# Patient Record
Sex: Female | Born: 1965 | Race: White | Hispanic: No | Smoking: Never smoker
Health system: Southern US, Community
[De-identification: ages and names within clinical notes are randomized; demographics above are authoritative.]

## PROBLEM LIST (undated history)

## (undated) DIAGNOSIS — E119 Type 2 diabetes mellitus without complications: Secondary | ICD-10-CM

## (undated) DIAGNOSIS — K219 Gastro-esophageal reflux disease without esophagitis: Secondary | ICD-10-CM

## (undated) DIAGNOSIS — Z8041 Family history of malignant neoplasm of ovary: Secondary | ICD-10-CM

## (undated) DIAGNOSIS — I10 Essential (primary) hypertension: Secondary | ICD-10-CM

## (undated) DIAGNOSIS — E611 Iron deficiency: Secondary | ICD-10-CM

## (undated) DIAGNOSIS — F32A Depression, unspecified: Secondary | ICD-10-CM

## (undated) DIAGNOSIS — F329 Major depressive disorder, single episode, unspecified: Secondary | ICD-10-CM

## (undated) HISTORY — DX: Gastro-esophageal reflux disease without esophagitis: K21.9

## (undated) HISTORY — DX: Type 2 diabetes mellitus without complications: E11.9

## (undated) HISTORY — PX: LASIK: SHX215

## (undated) HISTORY — PX: BREAST SURGERY: SHX581

## (undated) HISTORY — PX: COLONOSCOPY: SHX174

## (undated) HISTORY — PX: BREAST BIOPSY: SHX20

## (undated) HISTORY — DX: Family history of malignant neoplasm of ovary: Z80.41

## (undated) HISTORY — DX: Depression, unspecified: F32.A

## (undated) HISTORY — DX: Major depressive disorder, single episode, unspecified: F32.9

## (undated) HISTORY — DX: Iron deficiency: E61.1

## (undated) HISTORY — DX: Essential (primary) hypertension: I10

---

## 2005-01-25 ENCOUNTER — Ambulatory Visit: Payer: Self-pay | Admitting: Family Medicine

## 2006-01-10 ENCOUNTER — Ambulatory Visit: Payer: Self-pay

## 2006-02-15 ENCOUNTER — Ambulatory Visit: Payer: Self-pay | Admitting: Gastroenterology

## 2007-01-15 ENCOUNTER — Ambulatory Visit: Payer: Self-pay

## 2008-04-13 ENCOUNTER — Ambulatory Visit: Payer: Self-pay

## 2008-10-27 ENCOUNTER — Ambulatory Visit: Payer: Self-pay

## 2009-09-05 ENCOUNTER — Ambulatory Visit: Payer: Self-pay

## 2009-09-07 ENCOUNTER — Ambulatory Visit: Payer: Self-pay

## 2009-10-29 DIAGNOSIS — M542 Cervicalgia: Secondary | ICD-10-CM | POA: Insufficient documentation

## 2010-03-05 ENCOUNTER — Ambulatory Visit: Payer: Self-pay

## 2010-03-15 HISTORY — PX: CERVICAL DISCECTOMY: SHX98

## 2010-03-29 ENCOUNTER — Ambulatory Visit: Payer: Self-pay | Admitting: Neurosurgery

## 2010-04-10 ENCOUNTER — Ambulatory Visit: Payer: Self-pay | Admitting: General Surgery

## 2010-04-13 ENCOUNTER — Encounter: Admission: RE | Admit: 2010-04-13 | Discharge: 2010-04-13 | Payer: Self-pay | Admitting: Neurosurgery

## 2010-05-25 ENCOUNTER — Encounter: Admission: RE | Admit: 2010-05-25 | Discharge: 2010-05-25 | Payer: Self-pay | Admitting: Neurosurgery

## 2010-06-13 ENCOUNTER — Encounter
Admission: RE | Admit: 2010-06-13 | Discharge: 2010-06-13 | Payer: Self-pay | Source: Home / Self Care | Attending: Neurosurgery | Admitting: Neurosurgery

## 2010-07-15 ENCOUNTER — Other Ambulatory Visit: Payer: Self-pay | Admitting: Neurosurgery

## 2010-07-15 DIAGNOSIS — M542 Cervicalgia: Secondary | ICD-10-CM

## 2010-08-01 ENCOUNTER — Other Ambulatory Visit: Payer: Self-pay

## 2010-08-01 ENCOUNTER — Ambulatory Visit
Admission: RE | Admit: 2010-08-01 | Discharge: 2010-08-01 | Disposition: A | Discharge status: Home / Self Care | Payer: BC Managed Care – PPO | Source: Ambulatory Visit | Attending: Neurosurgery | Admitting: Neurosurgery

## 2010-08-01 DIAGNOSIS — M542 Cervicalgia: Secondary | ICD-10-CM

## 2010-10-25 ENCOUNTER — Ambulatory Visit: Payer: Self-pay | Admitting: General Surgery

## 2012-06-25 HISTORY — PX: BREAST BIOPSY: SHX20

## 2013-01-28 ENCOUNTER — Emergency Department: Payer: Self-pay | Admitting: Emergency Medicine

## 2013-05-07 LAB — CBC AND DIFFERENTIAL
HCT: 38 % (ref 36–46)
Hemoglobin: 12 g/dL (ref 12.0–16.0)
Platelets: 202 10*3/uL (ref 150–399)
WBC: 5.6 10^3/mL

## 2013-05-07 LAB — BASIC METABOLIC PANEL
BUN: 12 mg/dL (ref 4–21)
Creatinine: 0.7 mg/dL (ref 0.5–1.1)
Glucose: 136 mg/dL
Potassium: 3.9 mmol/L (ref 3.4–5.3)
Sodium: 141 mmol/L (ref 137–147)

## 2013-05-07 LAB — HEPATIC FUNCTION PANEL
ALT: 20 U/L (ref 7–35)
AST: 24 U/L (ref 13–35)

## 2013-05-15 LAB — TSH: TSH: 1.52 u[IU]/mL (ref <=5.90)

## 2013-06-29 ENCOUNTER — Ambulatory Visit: Payer: Self-pay | Admitting: Gastroenterology

## 2014-06-25 DIAGNOSIS — Z1371 Encounter for nonprocreative screening for genetic disease carrier status: Secondary | ICD-10-CM

## 2014-06-25 HISTORY — DX: Encounter for nonprocreative screening for genetic disease carrier status: Z13.71

## 2014-11-25 ENCOUNTER — Ambulatory Visit: Payer: BC Managed Care – PPO | Admitting: Podiatry

## 2014-11-26 ENCOUNTER — Other Ambulatory Visit: Payer: Self-pay

## 2014-11-26 ENCOUNTER — Other Ambulatory Visit: Payer: Self-pay | Admitting: Gastroenterology

## 2014-11-26 MED ORDER — AMLODIPINE BESYLATE 2.5 MG PO TABS: 2.5000 mg | ORAL_TABLET | Freq: Every day | ORAL | Status: DC

## 2015-01-05 ENCOUNTER — Other Ambulatory Visit: Payer: Self-pay

## 2015-01-05 DIAGNOSIS — D509 Iron deficiency anemia, unspecified: Secondary | ICD-10-CM

## 2015-01-05 MED ORDER — FERROUS SULFATE 325 (65 FE) MG PO TABS: 325.0000 mg | ORAL_TABLET | Freq: Every day | ORAL | Status: DC

## 2015-04-04 ENCOUNTER — Other Ambulatory Visit: Payer: Self-pay

## 2015-04-04 DIAGNOSIS — I10 Essential (primary) hypertension: Secondary | ICD-10-CM

## 2015-04-04 MED ORDER — HYDROCHLOROTHIAZIDE 12.5 MG PO CAPS: 12.5000 mg | ORAL_CAPSULE | Freq: Every day | ORAL | Status: DC

## 2015-04-04 NOTE — Telephone Encounter (Signed)
Last OV 05/2014  Thanks,   -Laura  

## 2015-07-18 DIAGNOSIS — J309 Allergic rhinitis, unspecified: Secondary | ICD-10-CM | POA: Insufficient documentation

## 2015-07-18 DIAGNOSIS — K219 Gastro-esophageal reflux disease without esophagitis: Secondary | ICD-10-CM | POA: Insufficient documentation

## 2015-07-18 DIAGNOSIS — F4320 Adjustment disorder, unspecified: Secondary | ICD-10-CM | POA: Insufficient documentation

## 2015-07-18 DIAGNOSIS — G2581 Restless legs syndrome: Secondary | ICD-10-CM | POA: Insufficient documentation

## 2015-07-28 ENCOUNTER — Encounter: Payer: Self-pay | Admitting: Family Medicine

## 2015-07-28 ENCOUNTER — Ambulatory Visit (INDEPENDENT_AMBULATORY_CARE_PROVIDER_SITE_OTHER): Payer: BC Managed Care – PPO | Admitting: Family Medicine

## 2015-07-28 VITALS — BP 142/110 | HR 84 | Temp 98.5°F | Resp 16 | Wt 174.4 lb

## 2015-07-28 DIAGNOSIS — I10 Essential (primary) hypertension: Secondary | ICD-10-CM

## 2015-07-28 DIAGNOSIS — R634 Abnormal weight loss: Secondary | ICD-10-CM

## 2015-07-28 NOTE — Progress Notes (Signed)
Subjective:     Patient ID: Misty Graham, female   DOB: 03-27-1966, 50 y.o.   MRN: [id number]  Hypertension This is a recurrent problem. The current episode started more than 1 year ago. The problem is unchanged. The problem is uncontrolled. Associated symptoms include blurred vision (needs her glasses more frequently. ), headaches (has headache today. ) and malaise/fatigue. Pertinent negatives include no anxiety, chest pain, neck pain, orthopnea, palpitations, peripheral edema, PND, shortness of breath or sweats. Risk factors for coronary artery disease include family history. Past treatments include diuretics and calcium channel blockers. The current treatment provides no improvement. There are no compliance problems.  There is no history of angina, kidney disease, CAD/MI, CVA, heart failure, left ventricular hypertrophy, PVD, renovascular disease, retinopathy or a thyroid problem. There is no history of chronic renal disease, coarctation of the aorta, hyperaldosteronism, hypercortisolism, hyperparathyroidism, a hypertension causing med, pheochromocytoma or sleep apnea.  Depression        This is a recurrent problem.  The current episode started more than 1 year ago.   The onset quality is gradual.   The problem occurs intermittently.The problem is unchanged (more as a mood stabilizer.  ).  Associated symptoms include fatigue and headaches (has headache today. ).  Associated symptoms include no decreased concentration, no helplessness, no hopelessness, does not have insomnia, not irritable, no restlessness, no decreased interest, no appetite change, no body aches, no myalgias, no indigestion, not sad and no suicidal ideas.     The symptoms are aggravated by work stress and family issues (Nothing out of the ordinary. ).  Past treatments include SSRIs - Selective serotonin reuptake inhibitors.  Compliance with treatment is good.  Previous treatment provided no relief relief.   Pertinent negatives include no  thyroid problem and no anxiety. Weight Loss Pt has lost 20 pounds since September, which is unexplained. Not under any stress. Not sure why she is loosing weight. Not decreasing her food intake. Not dieting. No diet pills.  Appetite has increased. Wants to eat all the time.  Was surprised that she had lost weight.  No night sweats, no fevers, no bloody stools, no change in bowel habits. Had normal colonoscopy several years ago. No family history that she is concerned about.     Review of Systems  Constitutional: Positive for malaise/fatigue and fatigue. Negative for appetite change.  Eyes: Positive for blurred vision (needs her glasses more frequently. ).  Respiratory: Negative for shortness of breath.   Cardiovascular: Negative for chest pain, palpitations, orthopnea and PND.  Musculoskeletal: Negative for myalgias and neck pain.  Neurological: Positive for headaches (has headache today. ).  Psychiatric/Behavioral: Positive for depression. Negative for suicidal ideas and decreased concentration. The patient does not have insomnia.    Filed Vitals:   07/28/15 1613  BP: 142/110  Pulse: 84  Temp: 98.5 F (36.9 C)  Resp: 16       Depression screen PHQ 2/9 07/28/2015  Decreased Interest 1  Down, Depressed, Hopeless 0  PHQ - 2 Score 1   Patient Active Problem List   Diagnosis Date Noted  . 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 Allergic rhinitis 07/18/2015  . Acid reflux 07/18/2015  . Restless leg 07/18/2015  . BP (high blood pressure) 04/04/2015  . Iron deficiency anemia 01/05/2015  . Cervical pain 10/29/2009   Past Medical History  Diagnosis Date  . Hypertension   . Depression   . Iron deficiency    Current Outpatient Prescriptions on File Prior to Visit  Medication Sig  . amLODipine (NORVASC) 2.5 MG tablet Take 1 tablet (2.5 mg total) by mouth daily.  . cetirizine (ZYRTEC ALLERGY) 10 MG tablet Take by mouth.  . DEXILANT 60 MG capsule TAKE 1 CAPSULE BY MOUTH DAILY  .  escitalopram (LEXAPRO) 20 MG tablet Take by mouth.  . ferrous sulfate 325 (65 FE) MG tablet Take 1 tablet (325 mg total) by mouth daily with breakfast.  . hydrochlorothiazide (MICROZIDE) 12.5 MG capsule Take 1 capsule (12.5 mg total) by mouth daily.   No current facility-administered medications on file prior to visit.   No Known Allergies Past Surgical History  Procedure Laterality Date  . Cervical discectomy  03/15/2010  . Vaginal birth after cesarean section  1993 and 1997  . Cesarean section  1991  . Breast surgery Right     biopsy; Dr. Evette Cristal  . Lasik     Social History   Social History  . Marital Status: Married    Spouse Name: N/A  . Number of Children: N/A  . Years of Education: N/A   Occupational History  . Not on file.   Social History Main Topics  . Smoking status: Never Smoker   . Smokeless tobacco: Never Used  . Alcohol Use: No  . Drug Use: No  . Sexual Activity: Not on file   Other Topics Concern  . Not on file   Social History Narrative   Family History  Problem Relation Age of Onset  . Lung cancer Mother   . COPD Father   . Hypertension Father   . Allergies Father   . Coronary artery disease Father   . Healthy Sister   . Heart attack Paternal Grandmother   . Heart attack Paternal Grandfather      Objective:   Physical Exam  Constitutional: She is oriented to person, place, and time. She appears well-developed and well-nourished. She is not irritable.  HENT:  Head: Normocephalic and atraumatic.  Right Ear: External ear normal.  Left Ear: External ear normal.  Mouth/Throat: Oropharynx is clear and moist.  Eyes: Conjunctivae are normal. Pupils are equal, round, and reactive to light.  Neck: Normal range of motion. Neck supple.  Cardiovascular: Normal rate and regular rhythm.   Pulmonary/Chest: Effort normal and breath sounds normal.  Abdominal: Soft. Bowel sounds are normal.  Neurological: She is alert and oriented to person, place, and  time.  Psychiatric: She has a normal mood and affect. Her behavior is normal. Thought content normal.   BP 142/110 mmHg  Pulse 84  Temp(Src) 98.5 F (36.9 C) (Oral)  Resp 16  Wt 174 lb 6.4 oz (79.107 kg)  LMP  (Within Months)     Assessment:     Will check labs and consider scan if needed.     Plan:     1. Abnormal weight loss Will check labs and further plan pending these results.   - CBC with Differential/Platelet - TSH - Comprehensive metabolic panel  2. Essential hypertension Elevated.  Will check labs and adjust medication as needed.   - TSH - Hemoglobin A1c  Lorie Phenix, MD

## 2015-07-29 ENCOUNTER — Telehealth: Payer: Self-pay

## 2015-07-29 ENCOUNTER — Ambulatory Visit (INDEPENDENT_AMBULATORY_CARE_PROVIDER_SITE_OTHER): Payer: BC Managed Care – PPO | Admitting: Family Medicine

## 2015-07-29 ENCOUNTER — Encounter: Payer: Self-pay | Admitting: Family Medicine

## 2015-07-29 VITALS — BP 144/92 | HR 84 | Temp 98.7°F | Resp 16 | Wt 172.0 lb

## 2015-07-29 DIAGNOSIS — I10 Essential (primary) hypertension: Secondary | ICD-10-CM | POA: Diagnosis not present

## 2015-07-29 DIAGNOSIS — E119 Type 2 diabetes mellitus without complications: Secondary | ICD-10-CM

## 2015-07-29 LAB — COMPREHENSIVE METABOLIC PANEL
ALT: 98 IU/L — ABNORMAL HIGH (ref 0–32)
AST: 52 IU/L — ABNORMAL HIGH (ref 0–40)
Albumin/Globulin Ratio: 2.1 (ref 1.1–2.5)
Albumin: 4.8 g/dL (ref 3.5–5.5)
Alkaline Phosphatase: 129 IU/L — ABNORMAL HIGH (ref 39–117)
BUN/Creatinine Ratio: 15 (ref 9–23)
BUN: 11 mg/dL (ref 6–24)
Bilirubin Total: 0.5 mg/dL (ref 0.0–1.2)
CO2: 27 mmol/L (ref 18–29)
Calcium: 9.9 mg/dL (ref 8.7–10.2)
Chloride: 93 mmol/L — ABNORMAL LOW (ref 96–106)
Creatinine, Ser: 0.74 mg/dL (ref 0.57–1.00)
GFR calc Af Amer: 109 mL/min/{1.73_m2} (ref >59)
GFR calc non Af Amer: 95 mL/min/{1.73_m2} (ref >59)
Globulin, Total: 2.3 g/dL (ref 1.5–4.5)
Glucose: 337 mg/dL — ABNORMAL HIGH (ref 65–99)
Potassium: 4 mmol/L (ref 3.5–5.2)
Sodium: 138 mmol/L (ref 134–144)
Total Protein: 7.1 g/dL (ref 6.0–8.5)

## 2015-07-29 LAB — CBC WITH DIFFERENTIAL/PLATELET
Basophils Absolute: 0 10*3/uL (ref 0.0–0.2)
Basos: 1 %
EOS (ABSOLUTE): 0.2 10*3/uL (ref 0.0–0.4)
Eos: 2 %
Hematocrit: 49.2 % — ABNORMAL HIGH (ref 34.0–46.6)
Hemoglobin: 16.9 g/dL — ABNORMAL HIGH (ref 11.1–15.9)
Immature Grans (Abs): 0 10*3/uL (ref 0.0–0.1)
Immature Granulocytes: 0 %
Lymphocytes Absolute: 3.4 10*3/uL — ABNORMAL HIGH (ref 0.7–3.1)
Lymphs: 49 %
MCH: 32.2 pg (ref 26.6–33.0)
MCHC: 34.3 g/dL (ref 31.5–35.7)
MCV: 94 fL (ref 79–97)
Monocytes Absolute: 0.4 10*3/uL (ref 0.1–0.9)
Monocytes: 6 %
Neutrophils Absolute: 2.9 10*3/uL (ref 1.4–7.0)
Neutrophils: 42 %
Platelets: 188 10*3/uL (ref 150–379)
RBC: 5.25 x10E6/uL (ref 3.77–5.28)
RDW: 13.3 % (ref 12.3–15.4)
WBC: 7 10*3/uL (ref 3.4–10.8)

## 2015-07-29 LAB — HEMOGLOBIN A1C
Est. average glucose Bld gHb Est-mCnc: 278 mg/dL
Hgb A1c MFr Bld: 11.3 % — ABNORMAL HIGH (ref 4.8–5.6)

## 2015-07-29 LAB — TSH: TSH: 2.73 u[IU]/mL (ref 0.450–4.500)

## 2015-07-29 MED ORDER — METFORMIN HCL 500 MG PO TABS: 500.0000 mg | ORAL_TABLET | Freq: Two times a day (BID) | ORAL | Status: DC

## 2015-07-29 MED ORDER — ONETOUCH DELICA LANCETS FINE MISC: Status: AC

## 2015-07-29 MED ORDER — LISINOPRIL-HYDROCHLOROTHIAZIDE 10-12.5 MG PO TABS: 1.0000 | ORAL_TABLET | Freq: Every day | ORAL | Status: DC

## 2015-07-29 MED ORDER — GLIPIZIDE ER 5 MG PO TB24: 5.0000 mg | ORAL_TABLET | Freq: Every day | ORAL | Status: DC

## 2015-07-29 MED ORDER — GLUCOSE BLOOD VI STRP: ORAL_STRIP | Status: DC

## 2015-07-29 NOTE — Telephone Encounter (Signed)
Pt advised; apt made for 3:30 this afternoon.   Thanks,   -Vernona Rieger

## 2015-07-29 NOTE — Progress Notes (Signed)
Patient ID: [name], female   DOB: [birth year], 50 y.o.   MRN: [id number]         Patient: SEMYA Graham Female    DOB: 1965/09/28   49 y.o.   MRN: [id number] Visit Date: 07/29/2015  Today's Provider: Lorie Phenix, MD   Chief Complaint  Patient presents with  . Diabetes   Subjective:    Diabetes She presents for her initial diabetic visit. She has type 2 diabetes mellitus. Hypoglycemia symptoms include headaches. Pertinent negatives for hypoglycemia include no dizziness, seizures or tremors. Associated symptoms include blurred vision, fatigue, polydipsia, polyphagia, polyuria and visual change. Pertinent negatives for diabetes include no foot ulcerations and no weakness. Risk factors for coronary artery disease include family history and hypertension. When asked about current treatments, none were reported. An ACE inhibitor/angiotensin II receptor blocker is not being taken. She does not see a podiatrist.Eye exam is current.       No Known Allergies Previous Medications   AMLODIPINE (NORVASC) 2.5 MG TABLET    Take 1 tablet (2.5 mg total) by mouth daily.   CETIRIZINE (ZYRTEC ALLERGY) 10 MG TABLET    Take by mouth.   DEXILANT 60 MG CAPSULE    TAKE 1 CAPSULE BY MOUTH DAILY   ESCITALOPRAM (LEXAPRO) 20 MG TABLET    Take by mouth.   FERROUS SULFATE 325 (65 FE) MG TABLET    Take 1 tablet (325 mg total) by mouth daily with breakfast.   HYDROCHLOROTHIAZIDE (MICROZIDE) 12.5 MG CAPSULE    Take 1 capsule (12.5 mg total) by mouth daily.    Review of Systems  Constitutional: Positive for appetite change, fatigue and unexpected weight change. Negative for fever, chills, diaphoresis and activity change.  Eyes: Positive for blurred vision.  Respiratory: Negative.   Cardiovascular: Negative.   Gastrointestinal: Negative.   Endocrine: Positive for heat intolerance, polydipsia, polyphagia and polyuria. Negative for cold intolerance.  Musculoskeletal: Negative.   Neurological: Positive for  light-headedness, numbness (Numbness in her face) and headaches. Negative for dizziness, tremors, seizures, syncope and weakness.    Social History  Substance Use Topics  . Smoking status: Never Smoker   . Smokeless tobacco: Never Used  . Alcohol Use: No   Objective:   BP 144/92 mmHg  Pulse 84  Temp(Src) 98.7 F (37.1 C) (Oral)  Resp 16  Wt 172 lb (78.019 kg)  LMP  (Within Months)  Physical Exam  Constitutional: She is oriented to person, place, and time. She appears well-developed and well-nourished.  Neurological: She is alert and oriented to person, place, and time.  Psychiatric: She has a normal mood and affect. Her behavior is normal. Judgment and thought content normal.      Assessment & Plan:      1. Type 2 diabetes mellitus without complication, without long-term current use of insulin (HCC) New problem. HgA1c is over 11.  Taught how to use monitor today and discussed diagnosis and treatment. Will start medication as noted. Refer to lifestyle center and recheck OV in 4 weeks.  Patient instructed to call back if condition worsens or does not improve.    - glucose blood (ONETOUCH VERIO) test strip; To check blood sugar once a day. DX E11.9  Dispense: 100 each; Refill: 5 - ONETOUCH DELICA LANCETS FINE MISC; To check blood sugar once a day DX:  E11.9  Dispense: 100 each; Refill: 5 - metFORMIN (GLUCOPHAGE) 500 MG tablet; Take 1 tablet (500 mg total) by mouth 2 (two) times daily with a  meal.  Dispense: 60 tablet; Refill: 5 - glipiZIDE (GLIPIZIDE XL) 5 MG 24 hr tablet; Take 1 tablet (5 mg total) by mouth daily with breakfast.  Dispense: 30 tablet; Refill: 5 - Amb Referral to Nutrition and Diabetic E  2. Essential hypertension Not at goal. Will add Lisinopril in light of diabetes and recheck ov and labs in 4 weeks.   - lisinopril-hydrochlorothiazide (PRINZIDE,ZESTORETIC) 10-12.5 MG tablet; Take 1 tablet by mouth daily.  Dispense: 30 tablet; Refill: 5  Patient was seen and  examined by Leo Grosser, MD, and note scribed by Kavin Leech, CMA.  I have reviewed the document for accuracy and completeness and I agree with above. - Leo Grosser, MD   Lorie Phenix, MD  [facility]

## 2015-07-29 NOTE — Telephone Encounter (Signed)
-----   Message from Lorie Phenix, MD sent at 07/29/2015 12:05 PM EST ----- Needs OV secondary to high blood sugar. Does have diabetes that she was concerned about.   Thanks.

## 2015-08-04 ENCOUNTER — Encounter: Payer: Self-pay | Admitting: Family Medicine

## 2015-08-08 ENCOUNTER — Other Ambulatory Visit: Payer: Self-pay

## 2015-08-08 ENCOUNTER — Encounter: Payer: Self-pay | Admitting: Dietician

## 2015-08-08 ENCOUNTER — Encounter: Payer: BC Managed Care – PPO | Attending: Family Medicine | Admitting: Dietician

## 2015-08-08 VITALS — BP 146/83 | Ht 66.0 in | Wt 173.6 lb

## 2015-08-08 DIAGNOSIS — E119 Type 2 diabetes mellitus without complications: Secondary | ICD-10-CM

## 2015-08-08 DIAGNOSIS — D509 Iron deficiency anemia, unspecified: Secondary | ICD-10-CM

## 2015-08-08 MED ORDER — FERROUS SULFATE 325 (65 FE) MG PO TABS: 325.0000 mg | ORAL_TABLET | Freq: Every day | ORAL | Status: DC

## 2015-08-08 NOTE — Progress Notes (Signed)
Diabetes Self-Management Education  Visit Type: First/Initial  Appt. Start Time: 1500 Appt. End Time: 1600  08/08/2015  [name], identified by name and date of birth, is a 50 y.o. female with a diagnosis of Diabetes: Type 2.   ASSESSMENT  Blood pressure 146/83, height  (1.676 m), weight 173 lb 9.6 oz (78.744 kg). Body mass index is 28.03 kg/(m^2).  Lacks knowledge of diabetes care  and diet Does not eat a bedtime snack Does no regular exercise Had recent low BG (weak, shaky,sweaty) which resolved when treated Recent A1C 11.3%       Diabetes Self-Management Education - 08/08/15 1845    Visit Information   Visit Type First/Initial   Initial Visit   Diabetes Type Type 2   Health Coping   How would you rate your overall health? Fair   Psychosocial Assessment   Patient Belief/Attitude about Diabetes Motivated to manage diabetes   Self-care barriers None   Other persons present Patient   Patient Concerns Glycemic Control;Weight Control;Medication  prevent complications   Special Needs None   Preferred Learning Style Auditory;Visual;Hands on   Learning Readiness Ready   What is the last grade level you completed in school? 16   Complications   How often do you check your blood sugar? --  2-3x/day   Fasting Blood glucose range (mg/dL) 40-981;191-478;295-621   Postprandial Blood glucose range (mg/dL) 308-657;846-962;>952   Have you had a dilated eye exam in the past 12 months? Yes  03-2015   Have you had a dental exam in the past 12 months? No  2 yr ago-appt 08-10-15   Dietary Intake   Breakfast --  eats breakfast at 6:45am   Snack (morning) --  eats snack at 9:30am=snack foods   Lunch --  eats lunch at 12:30p=eats fried foods 4-5x/wk, eats sweets/desserts 2-3x/wk   Snack (afternoon) --  eats snack at 3p=snack foods   Dinner --  eats supper at [employer] (evening) --  none   Beverage(s) --  drinks  diet sodas 6-7/day   Exercise   Exercise Type  ADL's   Patient Education   Previous Diabetes Education No   Disease state  --  discussed type 2 diabetes and treatment options   Nutrition management  Role of diet in the treatment of diabetes and the relationship between the three main macronutrients and blood glucose level;Food label reading, portion sizes and measuring food.;Carbohydrate counting   Physical activity and exercise  Role of exercise on diabetes management, blood pressure control and cardiac health.   Medications Reviewed patients medication for diabetes, action, purpose, timing of dose and side effects.   Monitoring Purpose and frequency of SMBG.;Taught/discussed recording of test results and interpretation of SMBG.;Identified appropriate SMBG and/or A1C goals.;Yearly dilated eye exam   Acute complications Taught treatment of hypoglycemia - the 15 rule.   Chronic complications Relationship between chronic complications and blood glucose control;Dental care;Retinopathy and reason for yearly dilated eye exams;Identified and discussed with patient  current chronic complications   Psychosocial adjustment Role of stress on diabetes   Personal strategies to promote health Helped patient develop diabetes management plan for (enter comment);Lifestyle issues that need to be addressed for better diabetes care      Individualized Plan for Diabetes Self-Management Training:   Learning Objective:  Patient will have a greater understanding of diabetes self-management. Patient education plan is to attend individual and/or group sessions per assessed needs and concerns.   Plan:   Patient Instructions  Check blood sugars 2 x day before breakfast and 2 hrs after supper every day Exercise:  Ride exercise bike 15-20 min 3-4x/wk.  Avoid sugar sweetened drinks (soda, tea, coffee, sports drinks, juices) Limit intake of sweets/desserts and fried foods Make healthy food choices Eat 3 meals day,   3  snacks a day Space meals 4-6 hours  apart Bring blood sugar records to the next appointment/class Get a Midwife fast acting glucose-candy or glucose tablets at all times  Return for appointment/classes on:  08-15-15   Expected Outcomes:    positive Education material provided: [employer] Guidelines, Low BG handout  If problems or questions, patient to contact team via:  [phone]  Future DSME appointment:  08-15-15

## 2015-08-08 NOTE — Patient Instructions (Signed)
  Check blood sugars 2 x day before breakfast and 2 hrs after supper every day Exercise:  Ride exercise bike 15-20 min 3-4x/wk.  Avoid sugar sweetened drinks (soda, tea, coffee, sports drinks, juices) Limit intake of sweets/desserts and fried foods Make healthy food choices Eat 3 meals day,   3  snacks a day Space meals 4-6 hours apart Bring blood sugar records to the next appointment/class Get a Midwife fast acting glucose-candy or glucose tablets at all times  Return for appointment/classes on:  08-15-15

## 2015-08-12 ENCOUNTER — Ambulatory Visit (INDEPENDENT_AMBULATORY_CARE_PROVIDER_SITE_OTHER): Payer: BC Managed Care – PPO | Admitting: Family Medicine

## 2015-08-12 ENCOUNTER — Encounter: Payer: Self-pay | Admitting: Family Medicine

## 2015-08-12 VITALS — BP 126/78 | Temp 98.3°F | Wt 174.0 lb

## 2015-08-12 DIAGNOSIS — E119 Type 2 diabetes mellitus without complications: Secondary | ICD-10-CM

## 2015-08-12 DIAGNOSIS — I10 Essential (primary) hypertension: Secondary | ICD-10-CM | POA: Diagnosis not present

## 2015-08-12 MED ORDER — SITAGLIPTIN PHOSPHATE 100 MG PO TABS: 100.0000 mg | ORAL_TABLET | Freq: Every day | ORAL | Status: DC

## 2015-08-12 MED ORDER — METFORMIN HCL 500 MG PO TABS: 1000.0000 mg | ORAL_TABLET | Freq: Two times a day (BID) | ORAL | Status: DC

## 2015-08-12 MED ORDER — METFORMIN HCL 1000 MG PO TABS: 1000.0000 mg | ORAL_TABLET | Freq: Two times a day (BID) | ORAL | Status: DC

## 2015-08-12 NOTE — Progress Notes (Signed)
Patient ID: Misty Graham, female   DOB: May 27, 1966, 50 y.o.   MRN: [id number]       [name] Female    DOB: Oct 18, 1965   49 y.o.   MRN: [id number] Visit Date: 08/12/2015  Today's Provider: Lorie Phenix, MD   Chief Complaint  Patient presents with  . Diabetes    2 week F/U.   Marland Kitchen Hypertension   Subjective:    HPI  Diabetes Mellitus Type II, Follow-up:   Lab Results  Component Value Date   HGBA1C 11.3* 07/28/2015    Last seen for diabetes 2 weeks ago.  Management since then includes starting Metformin  BID and Glipizide . She reports good compliance with treatment. She is not having side effects.  Home blood sugar records: trend: fluctuating a bit  Episodes of hypoglycemia? no Weight trend: stable Prior visit with dietician: yes - 2 weeks ago.  Current diet: well balanced  Pertinent Labs:    Component Value Date/Time   CREATININE 0.74 07/28/2015 1654   CREATININE 0.7 05/07/2013    Wt Readings from Last 3 Encounters:  08/12/15 174 lb (78.926 kg)  08/08/15 173 lb 9.6 oz (78.744 kg)  07/29/15 172 lb (78.019 kg)     Hypertension, follow-up:  BP Readings from Last 3 Encounters:  08/12/15 126/78  08/08/15 146/83  07/29/15 144/92    She was last seen for hypertension 2 weeks ago.  BP at that visit was 146/83. Management since that visit includes discontinuing HCTZ 12.5 and starting Lisinopril/ HCTZ 10/12.5mg  daily. She reports good compliance with treatment. She is not having side effects.  Patient denies chest pain, chest pressure/discomfort, fatigue, irregular heart beat, palpitations and tachypnea.    Weight trend: stable Wt Readings from Last 3 Encounters:  08/12/15 174 lb (78.926 kg)  08/08/15 173 lb 9.6 oz (78.744 kg)  07/29/15 172 lb (78.019 kg)    Current diet: well balanced          No Known Allergies Previous Medications   CETIRIZINE (ZYRTEC ALLERGY) 10 MG TABLET    Take by mouth daily.    DEXILANT 60 MG  CAPSULE    TAKE 1 CAPSULE BY MOUTH DAILY   ESCITALOPRAM (LEXAPRO) 20 MG TABLET    Take by mouth daily.    FERROUS SULFATE 325 (65 FE) MG TABLET    Take 1 tablet (325 mg total) by mouth daily with breakfast.   GLIPIZIDE (GLIPIZIDE XL) 5 MG 24 HR TABLET    Take 1 tablet (5 mg total) by mouth daily with breakfast.   GLUCOSE BLOOD (ONETOUCH VERIO) TEST STRIP    To check blood sugar once a day. DX E11.9   LISINOPRIL-HYDROCHLOROTHIAZIDE (PRINZIDE,ZESTORETIC) 10-12.5 MG TABLET    Take 1 tablet by mouth daily.   ONETOUCH DELICA LANCETS FINE MISC    To check blood sugar once a day DX:  E11.9    Review of Systems  Constitutional: Negative.   Respiratory: Negative.   Cardiovascular: Negative.   Endocrine: Negative.   Musculoskeletal: Negative.   Neurological: Negative.     Social History  Substance Use Topics  . Smoking status: Never Smoker   . Smokeless tobacco: Never Used  . Alcohol Use: No   Objective:   BP 126/78 mmHg  Temp(Src) 98.3 F (36.8 C)  Wt 174 lb (78.926 kg)  LMP  (Within Months)  Physical Exam  Constitutional: She is oriented to person, place, and time. She appears well-developed and well-nourished.  Neurological: She is alert and  oriented to person, place, and time.  Psychiatric: She has a normal mood and affect. Her behavior is normal. Judgment and thought content normal.      Assessment & Plan:     1. Essential hypertension Stable. F/U pending labs. Continue current medication.   - Comprehensive metabolic panel  2. Type 2 diabetes mellitus without complication, without long-term current use of insulin (HCC) Not at goal. Will increase Metformin to  BID and add Januvia  daily. Will F/U in 6 weeks to reassess. Will also update pneumonia vaccine at that time. Patient is also going to check on diet and exercise.   - CBC with Differential/Platelet - Comprehensive metabolic panel - sitaGLIPtin (JANUVIA) 100 MG tablet; Take 1 tablet (100 mg total) by mouth  daily.  Dispense: 30 tablet; Refill: 5 - metFORMIN (GLUCOPHAGE) 500 MG tablet; Take 2 tablets (1,000 mg total) by mouth 2 (two) times daily with a meal.  Dispense: 60 tablet; Refill: 5     Patient was seen and examined by Leo Grosser, MD, and scribed by Anson [place], CMA.  I have reviewed the document for accuracy and completeness and I agree with above. - Leo Grosser, MD  Lorie Phenix, MD  [facility]

## 2015-08-13 LAB — COMPREHENSIVE METABOLIC PANEL
ALT: 99 IU/L — ABNORMAL HIGH (ref 0–32)
AST: 56 IU/L — ABNORMAL HIGH (ref 0–40)
Albumin/Globulin Ratio: 2.3 (ref 1.1–2.5)
Albumin: 4.8 g/dL (ref 3.5–5.5)
Alkaline Phosphatase: 98 IU/L (ref 39–117)
BUN/Creatinine Ratio: 19 (ref 9–23)
BUN: 12 mg/dL (ref 6–24)
Bilirubin Total: 0.5 mg/dL (ref 0.0–1.2)
CO2: 24 mmol/L (ref 18–29)
Calcium: 10 mg/dL (ref 8.7–10.2)
Chloride: 99 mmol/L (ref 96–106)
Creatinine, Ser: 0.64 mg/dL (ref 0.57–1.00)
GFR calc Af Amer: 120 mL/min/{1.73_m2} (ref >59)
GFR calc non Af Amer: 104 mL/min/{1.73_m2} (ref >59)
Globulin, Total: 2.1 g/dL (ref 1.5–4.5)
Glucose: 156 mg/dL — ABNORMAL HIGH (ref 65–99)
Potassium: 4.2 mmol/L (ref 3.5–5.2)
Sodium: 142 mmol/L (ref 134–144)
Total Protein: 6.9 g/dL (ref 6.0–8.5)

## 2015-08-13 LAB — CBC WITH DIFFERENTIAL/PLATELET
Basophils Absolute: 0 10*3/uL (ref 0.0–0.2)
Basos: 1 %
EOS (ABSOLUTE): 0.1 10*3/uL (ref 0.0–0.4)
Eos: 2 %
Hematocrit: 47 % — ABNORMAL HIGH (ref 34.0–46.6)
Hemoglobin: 16.3 g/dL — ABNORMAL HIGH (ref 11.1–15.9)
Immature Grans (Abs): 0 10*3/uL (ref 0.0–0.1)
Immature Granulocytes: 0 %
Lymphocytes Absolute: 2.5 10*3/uL (ref 0.7–3.1)
Lymphs: 51 %
MCH: 32.5 pg (ref 26.6–33.0)
MCHC: 34.7 g/dL (ref 31.5–35.7)
MCV: 94 fL (ref 79–97)
Monocytes Absolute: 0.4 10*3/uL (ref 0.1–0.9)
Monocytes: 8 %
Neutrophils Absolute: 1.8 10*3/uL (ref 1.4–7.0)
Neutrophils: 38 %
Platelets: 181 10*3/uL (ref 150–379)
RBC: 5.02 x10E6/uL (ref 3.77–5.28)
RDW: 12.5 % (ref 12.3–15.4)
WBC: 4.9 10*3/uL (ref 3.4–10.8)

## 2015-08-15 ENCOUNTER — Encounter: Payer: BC Managed Care – PPO | Admitting: Dietician

## 2015-08-15 VITALS — Ht 66.0 in | Wt 172.4 lb

## 2015-08-15 DIAGNOSIS — E119 Type 2 diabetes mellitus without complications: Secondary | ICD-10-CM | POA: Diagnosis not present

## 2015-08-15 NOTE — Progress Notes (Signed)

## 2015-08-16 ENCOUNTER — Other Ambulatory Visit: Payer: Self-pay | Admitting: Obstetrics & Gynecology

## 2015-08-16 ENCOUNTER — Other Ambulatory Visit: Payer: Self-pay | Admitting: *Deleted

## 2015-08-16 ENCOUNTER — Telehealth: Payer: Self-pay

## 2015-08-16 ENCOUNTER — Encounter: Payer: Self-pay | Admitting: Family Medicine

## 2015-08-16 ENCOUNTER — Inpatient Hospital Stay
Admission: RE | Admit: 2015-08-16 | Discharge: 2015-08-16 | Disposition: A | Discharge status: Home / Self Care | Payer: Self-pay | Source: Ambulatory Visit | Attending: *Deleted | Admitting: *Deleted

## 2015-08-16 DIAGNOSIS — Z9289 Personal history of other medical treatment: Secondary | ICD-10-CM

## 2015-08-16 DIAGNOSIS — R928 Other abnormal and inconclusive findings on diagnostic imaging of breast: Secondary | ICD-10-CM

## 2015-08-16 NOTE — Telephone Encounter (Signed)
Advised pt of lab results. Pt verbally acknowledges understanding. Malaysha Arlen Drozdowski, CMA   

## 2015-08-16 NOTE — Telephone Encounter (Signed)
-----  Message from Margarita Rana, MD sent at 08/16/2015  3:13 PM EST ----- Labs improving but not back to normal. Repeat met c and cbc in 4 weeks.  Thanks.

## 2015-08-16 NOTE — Telephone Encounter (Signed)
Pt is returning call.  CB#[phone]/MW

## 2015-08-16 NOTE — Telephone Encounter (Signed)
LMTCB Keinan Brouillet Drozdowski, CMA  

## 2015-08-22 ENCOUNTER — Encounter: Payer: BC Managed Care – PPO | Admitting: Dietician

## 2015-08-22 VITALS — Wt 170.2 lb

## 2015-08-22 DIAGNOSIS — E119 Type 2 diabetes mellitus without complications: Secondary | ICD-10-CM | POA: Diagnosis not present

## 2015-08-22 NOTE — Progress Notes (Signed)

## 2015-08-23 ENCOUNTER — Other Ambulatory Visit: Payer: Self-pay

## 2015-08-23 DIAGNOSIS — F4322 Adjustment disorder with anxiety: Secondary | ICD-10-CM

## 2015-08-23 MED ORDER — ESCITALOPRAM OXALATE 20 MG PO TABS: 20.0000 mg | ORAL_TABLET | Freq: Every day | ORAL | Status: DC

## 2015-08-24 ENCOUNTER — Encounter: Payer: Self-pay | Admitting: Family Medicine

## 2015-08-29 ENCOUNTER — Encounter: Payer: Self-pay | Admitting: Dietician

## 2015-08-29 ENCOUNTER — Other Ambulatory Visit: Payer: Self-pay

## 2015-08-29 ENCOUNTER — Encounter: Payer: BC Managed Care – PPO | Attending: Family Medicine | Admitting: Dietician

## 2015-08-29 VITALS — BP 134/88 | Ht 66.0 in | Wt 172.6 lb

## 2015-08-29 DIAGNOSIS — E119 Type 2 diabetes mellitus without complications: Secondary | ICD-10-CM | POA: Diagnosis present

## 2015-08-29 DIAGNOSIS — K2100 Gastro-esophageal reflux disease with esophagitis, without bleeding: Secondary | ICD-10-CM

## 2015-08-29 MED ORDER — DEXLANSOPRAZOLE 60 MG PO CPDR: 1.0000 | DELAYED_RELEASE_CAPSULE | Freq: Every day | ORAL | Status: DC

## 2015-08-29 NOTE — Progress Notes (Signed)

## 2015-09-01 ENCOUNTER — Ambulatory Visit
Admission: RE | Admit: 2015-09-01 | Discharge: 2015-09-01 | Disposition: A | Discharge status: Home / Self Care | Payer: BC Managed Care – PPO | Source: Ambulatory Visit | Attending: Obstetrics & Gynecology | Admitting: Obstetrics & Gynecology

## 2015-09-01 ENCOUNTER — Other Ambulatory Visit: Payer: Self-pay | Admitting: Obstetrics & Gynecology

## 2015-09-01 DIAGNOSIS — R928 Other abnormal and inconclusive findings on diagnostic imaging of breast: Secondary | ICD-10-CM | POA: Diagnosis present

## 2015-09-02 ENCOUNTER — Other Ambulatory Visit: Payer: Self-pay

## 2015-09-02 DIAGNOSIS — F4322 Adjustment disorder with anxiety: Secondary | ICD-10-CM

## 2015-09-02 MED ORDER — ESCITALOPRAM OXALATE 20 MG PO TABS: 20.0000 mg | ORAL_TABLET | Freq: Every day | ORAL | Status: DC

## 2015-09-05 ENCOUNTER — Encounter: Payer: Self-pay | Admitting: Dietician

## 2015-09-06 ENCOUNTER — Other Ambulatory Visit: Payer: Self-pay | Admitting: Family Medicine

## 2015-09-06 DIAGNOSIS — E119 Type 2 diabetes mellitus without complications: Secondary | ICD-10-CM

## 2015-09-12 ENCOUNTER — Encounter: Payer: Self-pay | Admitting: Family Medicine

## 2015-09-12 DIAGNOSIS — I10 Essential (primary) hypertension: Secondary | ICD-10-CM

## 2015-09-12 NOTE — Telephone Encounter (Signed)
It looks like she is due for Met C and CBC.   Thanks,   -Mickel Baas

## 2015-09-12 NOTE — Telephone Encounter (Signed)
Please print out lab slip and notify patient.  Thanks.

## 2015-09-13 NOTE — Telephone Encounter (Signed)
Lab sheet at the front desk; pt advised.   Thanks,   -Dann Galicia  

## 2015-09-14 ENCOUNTER — Telehealth: Payer: Self-pay

## 2015-09-14 LAB — CBC WITH DIFFERENTIAL/PLATELET
Basophils Absolute: 0.1 10*3/uL (ref 0.0–0.2)
Basos: 1 %
EOS (ABSOLUTE): 0.3 10*3/uL (ref 0.0–0.4)
Eos: 4 %
Hematocrit: 42.7 % (ref 34.0–46.6)
Hemoglobin: 14.6 g/dL (ref 11.1–15.9)
Immature Grans (Abs): 0 10*3/uL (ref 0.0–0.1)
Immature Granulocytes: 0 %
Lymphocytes Absolute: 3.5 10*3/uL — ABNORMAL HIGH (ref 0.7–3.1)
Lymphs: 47 %
MCH: 31.9 pg (ref 26.6–33.0)
MCHC: 34.2 g/dL (ref 31.5–35.7)
MCV: 93 fL (ref 79–97)
Monocytes Absolute: 0.6 10*3/uL (ref 0.1–0.9)
Monocytes: 8 %
Neutrophils Absolute: 3 10*3/uL (ref 1.4–7.0)
Neutrophils: 40 %
Platelets: 196 10*3/uL (ref 150–379)
RBC: 4.58 x10E6/uL (ref 3.77–5.28)
RDW: 13.7 % (ref 12.3–15.4)
WBC: 7.4 10*3/uL (ref 3.4–10.8)

## 2015-09-14 LAB — COMPREHENSIVE METABOLIC PANEL
ALT: 69 IU/L — ABNORMAL HIGH (ref 0–32)
AST: 35 IU/L (ref 0–40)
Albumin/Globulin Ratio: 2.4 — ABNORMAL HIGH (ref 1.2–2.2)
Albumin: 4.8 g/dL (ref 3.5–5.5)
Alkaline Phosphatase: 86 IU/L (ref 39–117)
BUN/Creatinine Ratio: 14 (ref 9–23)
BUN: 10 mg/dL (ref 6–24)
Bilirubin Total: 0.4 mg/dL (ref 0.0–1.2)
CO2: 25 mmol/L (ref 18–29)
Calcium: 9.4 mg/dL (ref 8.7–10.2)
Chloride: 98 mmol/L (ref 96–106)
Creatinine, Ser: 0.7 mg/dL (ref 0.57–1.00)
GFR calc Af Amer: 117 mL/min/{1.73_m2} (ref >59)
GFR calc non Af Amer: 101 mL/min/{1.73_m2} (ref >59)
Globulin, Total: 2 g/dL (ref 1.5–4.5)
Glucose: 136 mg/dL — ABNORMAL HIGH (ref 65–99)
Potassium: 3.9 mmol/L (ref 3.5–5.2)
Sodium: 141 mmol/L (ref 134–144)
Total Protein: 6.8 g/dL (ref 6.0–8.5)

## 2015-09-14 NOTE — Telephone Encounter (Signed)
Pt advised.   Thanks,   -Jeymi Hepp  

## 2015-09-14 NOTE — Telephone Encounter (Signed)
-----   Message from Lorie PhenixNancy Maloney, MD sent at 09/14/2015  6:19 AM EDT ----- Labs looking much better. One liver enzyme slightly off. Recheck in 2 months.  Follow up as scheduled. Thanks.

## 2015-09-16 ENCOUNTER — Ambulatory Visit (INDEPENDENT_AMBULATORY_CARE_PROVIDER_SITE_OTHER): Payer: BC Managed Care – PPO | Admitting: Family Medicine

## 2015-09-16 ENCOUNTER — Encounter: Payer: Self-pay | Admitting: Family Medicine

## 2015-09-16 VITALS — BP 126/84 | HR 84 | Temp 98.8°F | Resp 16 | Wt 174.0 lb

## 2015-09-16 DIAGNOSIS — E119 Type 2 diabetes mellitus without complications: Secondary | ICD-10-CM | POA: Diagnosis not present

## 2015-09-16 DIAGNOSIS — Z23 Encounter for immunization: Secondary | ICD-10-CM | POA: Insufficient documentation

## 2015-09-16 DIAGNOSIS — I10 Essential (primary) hypertension: Secondary | ICD-10-CM

## 2015-09-16 MED ORDER — GLIPIZIDE ER 2.5 MG PO TB24: 2.5000 mg | ORAL_TABLET | Freq: Every day | ORAL | Status: DC

## 2015-09-16 NOTE — Progress Notes (Signed)
[name] Female    DOB: 10-Dec-1965   50 y.o.   MRN: [id number] Visit Date: 09/16/2015  Today's Provider: Lorie PhenixNancy Milta Croson, MD   Chief Complaint  Patient presents with  . Diabetes  . Hypertension   Subjective:    HPI  Diabetes Mellitus Type II, Follow-up:   Lab Results  Component Value Date   HGBA1C 11.3* 07/28/2015   Last seen for diabetes 6 weeks ago.  Management since then includes Started Januvia. She reports excellent compliance with treatment. She is not having side effects.  Current symptoms include none and have been improving. Home blood sugar records: 100's - 120's  Episodes of hypoglycemia? yes - She reports having about 7 lows usually around mid morning.   Most Recent Eye Exam: 03/2015 Weight trend: stable Prior visit with dietician: yes  Current diet: in general, a "healthy" diet   Current exercise: none  ------------------------------------------------------------------------   Hypertension, follow-up:  BP Readings from Last 3 Encounters:  09/16/15 126/84  08/29/15 134/88  08/12/15 126/78    She was last seen for hypertension 6 weeks ago.  Management since that visit includes Added Lisinopril .She reports excellent compliance with treatment. She is not having side effects.  She is not exercising. Outside blood pressures are High 130's / 90's. She is experiencing none.  Patient denies chest pain, fatigue, irregular heart beat, lower extremity edema and palpitations.   Cardiovascular risk factors include diabetes mellitus and hypertension.  ------------------------------------------------------------------------      No Known Allergies Previous Medications   CETIRIZINE (ZYRTEC ALLERGY) 10 MG TABLET    Take by mouth daily.    DEXLANSOPRAZOLE (DEXILANT) 60 MG CAPSULE    Take 1 capsule (60 mg total) by mouth daily. Pt needs a follow up appointment   ESCITALOPRAM (LEXAPRO) 20 MG TABLET    Take 1 tablet (20 mg total) by mouth  daily.   FERROUS SULFATE 325 (65 FE) MG TABLET    Take 1 tablet (325 mg total) by mouth daily with breakfast.   GLIPIZIDE (GLIPIZIDE XL) 5 MG 24 HR TABLET    Take 1 tablet (5 mg total) by mouth daily with breakfast.   LISINOPRIL-HYDROCHLOROTHIAZIDE (PRINZIDE,ZESTORETIC) 10-12.5 MG TABLET    Take 1 tablet by mouth daily.   METFORMIN (GLUCOPHAGE) 1000 MG TABLET    Take 1 tablet (1,000 mg total) by mouth 2 (two) times daily with a meal.   ONETOUCH DELICA LANCETS FINE MISC    To check blood sugar once a day DX:  E11.9   ONETOUCH VERIO TEST STRIP    TEST twice a day   SITAGLIPTIN (JANUVIA) 100 MG TABLET    Take 1 tablet (100 mg total) by mouth daily.    Review of Systems  Constitutional: Negative.   Respiratory: Negative.   Cardiovascular: Negative.   Gastrointestinal: Positive for abdominal pain (Had some lower abdominal pain a week or two ago; but has since resolved. ). Negative for nausea, vomiting, diarrhea, constipation, blood in stool, abdominal distention, anal bleeding and rectal pain.  Neurological: Positive for light-headedness. Negative for numbness.    Social History  Substance Use Topics  . Smoking status: Never Smoker   . Smokeless tobacco: Never Used  . Alcohol Use: No   Objective:   BP 126/84 mmHg  Pulse 84  Temp(Src) 98.8 F (37.1 C) (Oral)  Resp 16  Wt 174 lb (78.926 kg)  LMP  (Within Months)  Physical Exam  Constitutional: She is oriented to  person, place, and time. She appears well-developed and well-nourished.  Cardiovascular: Normal rate and regular rhythm.   Pulmonary/Chest: Effort normal and breath sounds normal.  Neurological: She is alert and oriented to person, place, and time.  Psychiatric: She has a normal mood and affect. Her behavior is normal. Judgment and thought content normal.      Assessment & Plan:     1. Essential hypertension Improved. Tolerating medication without difficulty. Continue current medication and recheck at follow up.    2.  Type 2 diabetes mellitus without complication, without long-term current use of insulin (HCC) Improved. Having some lows. Recommend decrease Glipizide to 2.5 mg and recheck HgbA1c in 6 weeks.  Continue lifestyle changes.   3. Need for 23-polyvalent pneumococcal polysaccharide vaccine Given today.  - Pneumococcal polysaccharide vaccine 23-valent greater than or equal to 2yo subcutaneous/IM    Patient was seen and examined by Leo Grosser, MD, and note scribed by Kavin Leech, CMA.  I have reviewed the document for accuracy and completeness and I agree with above. - Leo Grosser, MD   Lorie Phenix, MD  [facility]

## 2015-09-21 ENCOUNTER — Encounter: Payer: Self-pay | Admitting: Family Medicine

## 2015-09-22 ENCOUNTER — Ambulatory Visit
Admission: RE | Admit: 2015-09-22 | Discharge: 2015-09-22 | Disposition: A | Discharge status: Home / Self Care | Payer: BC Managed Care – PPO | Source: Ambulatory Visit | Attending: Family Medicine | Admitting: Family Medicine

## 2015-09-22 ENCOUNTER — Encounter: Payer: Self-pay | Admitting: Family Medicine

## 2015-09-22 ENCOUNTER — Ambulatory Visit (INDEPENDENT_AMBULATORY_CARE_PROVIDER_SITE_OTHER): Payer: BC Managed Care – PPO | Admitting: Family Medicine

## 2015-09-22 VITALS — BP 106/78 | HR 84 | Temp 98.2°F | Resp 16 | Wt 175.0 lb

## 2015-09-22 DIAGNOSIS — R590 Localized enlarged lymph nodes: Secondary | ICD-10-CM

## 2015-09-22 DIAGNOSIS — M542 Cervicalgia: Secondary | ICD-10-CM | POA: Diagnosis not present

## 2015-09-22 MED ORDER — CYCLOBENZAPRINE HCL 5 MG PO TABS: 5.0000 mg | ORAL_TABLET | Freq: Three times a day (TID) | ORAL | Status: DC | PRN

## 2015-09-22 MED ORDER — PREDNISONE 10 MG PO TABS: ORAL_TABLET | ORAL | Status: AC

## 2015-09-22 NOTE — Progress Notes (Signed)
[name] Female    DOB: 10/13/1965   49 y.o.   MRN: [id number] Visit Date: 09/22/2015  Today's Provider: Mila Merry, MD   Chief Complaint  Patient presents with  . Neck Pain   Subjective:    Neck Pain  Episode onset: 3 days ago woke up with neck pain. The problem occurs constantly. The problem has been unchanged. The pain is present in the right side. The quality of the pain is described as aching and shooting (sharp). Exacerbated by: turning head side to side. Stiffness is present all day. Associated symptoms include headaches and weakness. Pertinent negatives include no chest pain, fever, leg pain, numbness, pain with swallowing, syncope, tingling, trouble swallowing or visual change. She has tried NSAIDs and heat for the symptoms. The treatment provided mild relief.  Pain in neck radiates down her arms. Patient reports she has had neck surgery about 5 years ago.       No Known Allergies Previous Medications   CETIRIZINE (ZYRTEC ALLERGY) 10 MG TABLET    Take by mouth daily.    DEXLANSOPRAZOLE (DEXILANT) 60 MG CAPSULE    Take 1 capsule (60 mg total) by mouth daily. Pt needs a follow up appointment   ESCITALOPRAM (LEXAPRO) 20 MG TABLET    Take 1 tablet (20 mg total) by mouth daily.   FERROUS SULFATE 325 (65 FE) MG TABLET    Take 1 tablet (325 mg total) by mouth daily with breakfast.   GLIPIZIDE (GLUCOTROL XL) 2.5 MG 24 HR TABLET    Take 1 tablet (2.5 mg total) by mouth daily with breakfast.   LISINOPRIL-HYDROCHLOROTHIAZIDE (PRINZIDE,ZESTORETIC) 10-12.5 MG TABLET    Take 1 tablet by mouth daily.   METFORMIN (GLUCOPHAGE) 1000 MG TABLET    Take 1 tablet (1,000 mg total) by mouth 2 (two) times daily with a meal.   ONETOUCH DELICA LANCETS FINE MISC    To check blood sugar once a day DX:  E11.9   ONETOUCH VERIO TEST STRIP    TEST twice a day   SITAGLIPTIN (JANUVIA) 100 MG TABLET    Take 1 tablet (100 mg total) by mouth daily.    Review of Systems  Constitutional:  Negative for fever, chills, appetite change and fatigue.  HENT: Negative for congestion, dental problem, drooling, ear discharge, ear pain, facial swelling, mouth sores, nosebleeds, postnasal drip, rhinorrhea, sinus pressure and trouble swallowing.   Respiratory: Negative for cough, chest tightness and shortness of breath.   Cardiovascular: Negative for chest pain, palpitations and syncope.  Gastrointestinal: Negative for nausea, vomiting and abdominal pain.  Musculoskeletal: Positive for neck pain and neck stiffness.  Neurological: Positive for weakness and headaches. Negative for dizziness, tingling and numbness.    Social History  Substance Use Topics  . Smoking status: Never Smoker   . Smokeless tobacco: Never Used  . Alcohol Use: No   Objective:   BP 106/78 mmHg  Pulse 84  Temp(Src) 98.2 F (36.8 C) (Oral)  Resp 16  Wt 175 lb (79.379 kg)  SpO2 96%  LMP  (Within Months)  Physical Exam  General Appearance:    Alert, cooperative, no distress  HENT:   bilateral TM normal without fluid or infection, throat normal without erythema or exudate and sinuses non-tender. Slightly tender left submandibular lymph nodes.   Eyes:    PERRL, conjunctiva/corneas clear, EOM's intact       Lungs:     Clear to auscultation bilaterally, respirations unlabored  Heart:  Regular rate and rhythm  Neurologic:   Awake, alert, oriented x 3. No apparent focal neurological           defect.   Neck:   Tender right paracervical muscles. Rotation limited to 45 degrees due to pain. Light c-spine tenderness. No gross deformities.        Assessment & Plan:     1. Neck pain  - predniSONE (DELTASONE) 10 MG tablet; 6 tablets for 2 days, then 5 for 2 days, then 4 for 2 days, then 3 for 2 days, then 2 for 2 days, then 1 for 2 days.  Dispense: 42 tablet; Refill: 0 - cyclobenzaprine (FLEXERIL) 5 MG tablet; Take 1-2 tablets (5-10 mg total) by mouth 3 (three) times daily as needed for muscle spasms.  Dispense: 30  tablet; Refill: 1 - DG Cervical Spine Complete; Future  2. LAD (lymphadenopathy), submandibular She states had been sore left side of jaw for 5-6 days. No other signs of infection. Advised to call if this does not resolve within another 7 days.         Mila Merryonald Jhovanny Guinta, MD  [facility]

## 2015-09-23 ENCOUNTER — Ambulatory Visit: Payer: BC Managed Care – PPO | Admitting: Family Medicine

## 2015-10-11 ENCOUNTER — Other Ambulatory Visit: Payer: Self-pay

## 2015-10-11 DIAGNOSIS — K2100 Gastro-esophageal reflux disease with esophagitis, without bleeding: Secondary | ICD-10-CM

## 2015-10-11 MED ORDER — DEXLANSOPRAZOLE 60 MG PO CPDR: 1.0000 | DELAYED_RELEASE_CAPSULE | Freq: Every day | ORAL | Status: DC

## 2015-10-21 ENCOUNTER — Telehealth: Payer: Self-pay | Admitting: Family Medicine

## 2015-10-21 ENCOUNTER — Encounter: Payer: Self-pay | Admitting: Family Medicine

## 2015-10-21 ENCOUNTER — Ambulatory Visit (INDEPENDENT_AMBULATORY_CARE_PROVIDER_SITE_OTHER): Payer: BC Managed Care – PPO | Admitting: Family Medicine

## 2015-10-21 VITALS — BP 120/82 | HR 80 | Temp 97.4°F | Resp 16 | Wt 178.0 lb

## 2015-10-21 DIAGNOSIS — M542 Cervicalgia: Secondary | ICD-10-CM

## 2015-10-21 MED ORDER — MELOXICAM 15 MG PO TABS: 15.0000 mg | ORAL_TABLET | Freq: Every day | ORAL | Status: DC

## 2015-10-21 NOTE — Telephone Encounter (Signed)
[employer] would like a peer to peer to get MRI of cervical spine approved.Phone [phone].Member # [id number]

## 2015-10-21 NOTE — Progress Notes (Signed)
Subjective:    Patient ID: Misty Graham, female    DOB: Dec 02, 1965, 50 y.o.   MRN: [id number]  Neck Pain  This is a recurrent problem. The current episode started in the past 7 days (x 4 days). The problem has been waxing and waning. The pain is present in the right side (with radiation down right arm). The quality of the pain is described as shooting ("stiff"). The pain is at a severity of 5/10 (can get to an 8/10). The pain is moderate. The symptoms are aggravated by position. The pain is worse during the night. Associated symptoms include numbness (right arm). Pertinent negatives include no chest pain, fever, headaches, leg pain, pain with swallowing or weakness. Treatments tried: Prednisone, Flexeril, NSAIDS. The treatment provided mild ("short-term") relief.  Pt has a H/O herniated discs. XRay was performed 09/22/2015. Showed post-surgical fusion.    Review of Systems  Constitutional: Negative for fever.  Cardiovascular: Negative for chest pain.  Musculoskeletal: Positive for neck pain.  Neurological: Positive for numbness (right arm). Negative for weakness and headaches.   BP 120/82 mmHg  Pulse 80  Temp(Src) 97.4 F (36.3 C) (Oral)  Resp 16  Wt 178 lb (80.74 kg)  LMP  (Within Months)   Patient Active Problem List   Diagnosis Date Noted  . Need for 23-polyvalent pneumococcal polysaccharide vaccine 09/16/2015  . Diabetes mellitus (HCC) 09/06/2015  . Abnormal weight loss 07/28/2015  . 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 Allergic rhinitis 07/18/2015  . Acid reflux 07/18/2015  . Restless leg 07/18/2015  . BP (high blood pressure) 04/04/2015  . Iron deficiency anemia 01/05/2015  . Cervical pain 10/29/2009   Past Medical History  Diagnosis Date  . Hypertension   . Depression   . Iron deficiency   . GERD (gastroesophageal reflux disease)    Current Outpatient Prescriptions on File Prior to Visit  Medication Sig  . cetirizine (ZYRTEC ALLERGY) 10 MG tablet Take by mouth  daily.   Marland Kitchen. dexlansoprazole (DEXILANT) 60 MG capsule Take 1 capsule (60 mg total) by mouth daily. PT NEEDS FOLLOW UP APPT. NO MORE REFILLS AFTER THIS RX  . escitalopram (LEXAPRO) 20 MG tablet Take 1 tablet (20 mg total) by mouth daily.  . ferrous sulfate 325 (65 FE) MG tablet Take 1 tablet (325 mg total) by mouth daily with breakfast.  . glipiZIDE (GLUCOTROL XL) 2.5 MG 24 hr tablet Take 1 tablet (2.5 mg total) by mouth daily with breakfast.  . lisinopril-hydrochlorothiazide (PRINZIDE,ZESTORETIC) 10-12.5 MG tablet Take 1 tablet by mouth daily.  . metFORMIN (GLUCOPHAGE) 1000 MG tablet Take 1 tablet (1,000 mg total) by mouth 2 (two) times daily with a meal.  . ONETOUCH DELICA LANCETS FINE MISC To check blood sugar once a day DX:  E11.9  . ONETOUCH VERIO test strip TEST twice a day  . sitaGLIPtin (JANUVIA) 100 MG tablet Take 1 tablet (100 mg total) by mouth daily.  . cyclobenzaprine (FLEXERIL) 5 MG tablet Take 1-2 tablets (5-10 mg total) by mouth 3 (three) times daily as needed for muscle spasms. (Patient not taking: Reported on 10/21/2015)   No current facility-administered medications on file prior to visit.   No Known Allergies Past Surgical History  Procedure Laterality Date  . Cervical discectomy  03/15/2010  . Vaginal birth after cesarean section  1993 and 1997  . Cesarean section  1991  . Breast surgery Right     biopsy; Dr. Evette CristalSankar  . Lasik    . Breast biopsy Right 4-5 yrs  ago    core - neg   Social History   Social History  . Marital Status: Married    Spouse Name: N/A  . Number of Children: N/A  . Years of Education: N/A   Occupational History  . Not on file.   Social History Main Topics  . Smoking status: Never Smoker   . Smokeless tobacco: Never Used  . Alcohol Use: No  . Drug Use: No  . Sexual Activity: Not on file   Other Topics Concern  . Not on file   Social History Narrative   Family History  Problem Relation Age of Onset  . Lung cancer Mother   . COPD  Father   . Hypertension Father   . Allergies Father   . Coronary artery disease Father   . Healthy Sister   . Heart attack Paternal Grandmother   . Heart attack Paternal Grandfather   . Breast cancer Neg Hx       Objective:   Physical Exam  Constitutional: She is oriented to person, place, and time. She appears well-developed and well-nourished.  Cardiovascular: Normal rate and regular rhythm.   Pulmonary/Chest: Effort normal and breath sounds normal.  Musculoskeletal:  Some tenderness to palpation.    Neurological: She is alert and oriented to person, place, and time. She has normal strength. No cranial nerve deficit. Coordination normal.  Reflexes normal bilaterally.  Skin:  Decreased sensation in upper right arm.    Psychiatric: She has a normal mood and affect. Her behavior is normal.    BP 120/82 mmHg  Pulse 80  Temp(Src) 97.4 F (36.3 C) (Oral)  Resp 16  Wt 178 lb (80.74 kg)  LMP  (Within Months)     Assessment & Plan:  1. Neck pain on right side New problem. Really recurrent   Worsening. Pt had spinal surgery in 2011. Pt states pain is not severe enough to consider surgery again at this point.  Will order MRI, as C-Spine XR was negative last month. Start Mobic. - MR Cervical Spine Wo Contrast; Future - meloxicam (MOBIC) 15 MG tablet; Take 1 tablet (15 mg total) by mouth daily.  Dispense: 30 tablet; Refill: 1   Patient seen and examined by Leo Grosser, MD, and note scribed by Allene Dillon, CMA.  I have reviewed the document for accuracy and completeness and I agree with above. Leo Grosser, MD   Lorie Phenix, MD

## 2015-10-24 NOTE — Telephone Encounter (Signed)
Talked with patient. Will re-assess on Friday.  Thanks.

## 2015-10-24 NOTE — Telephone Encounter (Signed)
Please see how patient is feeling. Insurance is balking on MRI. Check if symptoms are improving or about the same and see if wants MRI before I call and try to get it approved.  Thanks.

## 2015-10-28 ENCOUNTER — Encounter: Payer: Self-pay | Admitting: Family Medicine

## 2015-10-28 ENCOUNTER — Ambulatory Visit (INDEPENDENT_AMBULATORY_CARE_PROVIDER_SITE_OTHER): Payer: BC Managed Care – PPO | Admitting: Family Medicine

## 2015-10-28 VITALS — BP 110/80 | HR 80 | Temp 98.7°F | Resp 16 | Wt 178.0 lb

## 2015-10-28 DIAGNOSIS — E119 Type 2 diabetes mellitus without complications: Secondary | ICD-10-CM

## 2015-10-28 DIAGNOSIS — M542 Cervicalgia: Secondary | ICD-10-CM | POA: Diagnosis not present

## 2015-10-28 LAB — POCT GLYCOSYLATED HEMOGLOBIN (HGB A1C)
Est. average glucose Bld gHb Est-mCnc: 148
Hemoglobin A1C: 6.8

## 2015-10-28 NOTE — Progress Notes (Signed)
Patient ID: Misty Graham, female   DOB: [birth year], 50 y.o.   MRN: [id number]       [name] Female    DOB: February 22, 1966   50 y.o.   MRN: [id number] Visit Date: 10/28/2015  Today's Provider: Margarita Rana, MD   Chief Complaint  Patient presents with  . Diabetes  . Neck Pain   Subjective:    HPI  Follow up for neck pain  The patient was last seen for this 6 weeks ago. Changes made at last visit include starting Meloxicam 15 mg.  She reports excellent compliance with treatment. She feels that condition is Improved. She is not having side effects.  ------------------------------------------------------------------------------------     Diabetes Mellitus Type II, Follow-up:   Lab Results  Component Value Date   HGBA1C 6.8 10/28/2015   HGBA1C 11.3* 07/28/2015    Last seen for diabetes 3 months ago.  Management since then includes no changes. She reports excellent compliance with treatment. She is not having side effects.  Current symptoms include hypoglycemia  and have been stable. Home blood sugar records: fasting range: 120  Episodes of hypoglycemia? no   Current Insulin Regimen: Most Recent Eye Exam: yes. AEC Weight trend: stable Prior visit with dietician: yes Current diet: not asked Current exercise: none  Pertinent Labs:    Component Value Date/Time   CREATININE 0.70 09/13/2015 1616   CREATININE 0.7 05/07/2013    Wt Readings from Last 3 Encounters:  10/28/15 178 lb (80.74 kg)  10/21/15 178 lb (80.74 kg)  09/22/15 175 lb (79.379 kg)    ------------------------------------------------------------------------     No Known Allergies Previous Medications   CETIRIZINE (ZYRTEC ALLERGY) 10 MG TABLET    Take by mouth daily.    DEXLANSOPRAZOLE (DEXILANT) 60 MG CAPSULE    Take 1 capsule (60 mg total) by mouth daily. PT NEEDS FOLLOW UP APPT. NO MORE REFILLS AFTER THIS RX   ESCITALOPRAM (LEXAPRO) 20 MG TABLET    Take 1 tablet (20 mg total)  by mouth daily.   FERROUS SULFATE 325 (65 FE) MG TABLET    Take 1 tablet (325 mg total) by mouth daily with breakfast.   GLIPIZIDE (GLUCOTROL XL) 2.5 MG 24 HR TABLET    Take 1 tablet (2.5 mg total) by mouth daily with breakfast.   LISINOPRIL-HYDROCHLOROTHIAZIDE (PRINZIDE,ZESTORETIC) 10-12.5 MG TABLET    Take 1 tablet by mouth daily.   MELOXICAM (MOBIC) 15 MG TABLET    Take 1 tablet (15 mg total) by mouth daily.   METFORMIN (GLUCOPHAGE) 1000 MG TABLET    Take 1 tablet (1,000 mg total) by mouth 2 (two) times daily with a meal.   ONETOUCH DELICA LANCETS FINE MISC    To check blood sugar once a day DX:  E11.9   ONETOUCH VERIO TEST STRIP    TEST twice a day   SITAGLIPTIN (JANUVIA) 100 MG TABLET    Take 1 tablet (100 mg total) by mouth daily.    Review of Systems  Constitutional: Negative.   Respiratory: Negative.   Cardiovascular: Negative.   Neurological: Negative.        Neck feeling much better.     Social History  Substance Use Topics  . Smoking status: Never Smoker   . Smokeless tobacco: Never Used  . Alcohol Use: No   Objective:   BP 110/80 mmHg  Pulse 80  Temp(Src) 98.7 F (37.1 C) (Oral)  Resp 16  Wt 178 lb (80.74 kg)  SpO2 96%  LMP  (  Within Months)  Physical Exam  Constitutional: She is oriented to person, place, and time. She appears well-developed and well-nourished.  Neurological: She is alert and oriented to person, place, and time.  Psychiatric: She has a normal mood and affect. Her behavior is normal. Judgment and thought content normal.      Assessment & Plan:     1. Type 2 diabetes mellitus without complication, without long-term current use of insulin (HCC) Improved. Patient advised to continue current medications and plan of care. If sugars continue to improve and experiences any lows, stop Glipizide.   - POCT glycosylated hemoglobin (Hb A1C) Results for orders placed or performed in visit on 10/28/15  POCT glycosylated hemoglobin (Hb A1C)  Result Value  Ref Range   Hemoglobin A1C 6.8    Est. average glucose Bld gHb Est-mCnc 148   .  2. Neck pain on right side Improved. Patient advised to continue current medication and plan of care. Discussed with patient and met with Tawanna Sat today about previous history of surgery. Will hold off on MRI for now. Patient instructed to call back if condition worsens or does not improve.        Patient seen and examined by Dr. Jerrell Belfast, and note scribed by Philbert Riser. Dimas, CMA.  I have reviewed the document for accuracy and completeness and I agree with above. - Jerrell Belfast, MD   Margarita Rana, MD  [facility]

## 2016-01-16 ENCOUNTER — Other Ambulatory Visit: Payer: Self-pay | Admitting: Family Medicine

## 2016-01-16 DIAGNOSIS — I10 Essential (primary) hypertension: Secondary | ICD-10-CM

## 2016-02-02 ENCOUNTER — Ambulatory Visit: Payer: BC Managed Care – PPO | Admitting: Physician Assistant

## 2016-02-02 ENCOUNTER — Ambulatory Visit (INDEPENDENT_AMBULATORY_CARE_PROVIDER_SITE_OTHER): Payer: BC Managed Care – PPO | Admitting: Physician Assistant

## 2016-02-02 ENCOUNTER — Encounter: Payer: Self-pay | Admitting: Physician Assistant

## 2016-02-02 VITALS — BP 106/82 | HR 96 | Temp 97.5°F | Resp 16 | Wt 179.0 lb

## 2016-02-02 DIAGNOSIS — R7989 Other specified abnormal findings of blood chemistry: Secondary | ICD-10-CM | POA: Diagnosis not present

## 2016-02-02 DIAGNOSIS — E119 Type 2 diabetes mellitus without complications: Secondary | ICD-10-CM

## 2016-02-02 DIAGNOSIS — R945 Abnormal results of liver function studies: Secondary | ICD-10-CM

## 2016-02-02 LAB — POCT UA - MICROALBUMIN: Microalbumin Ur, POC: <20 mg/L

## 2016-02-02 LAB — POCT GLYCOSYLATED HEMOGLOBIN (HGB A1C)
Est. average glucose Bld gHb Est-mCnc: 160
Hemoglobin A1C: 7.2

## 2016-02-02 MED ORDER — GLIPIZIDE ER 5 MG PO TB24: 5.0000 mg | ORAL_TABLET | Freq: Every day | ORAL | 5 refills | Status: DC

## 2016-02-02 MED ORDER — SITAGLIPTIN PHOSPHATE 100 MG PO TABS: 100.0000 mg | ORAL_TABLET | Freq: Every day | ORAL | 5 refills | Status: DC

## 2016-02-02 NOTE — Progress Notes (Signed)
[name] Female    DOB: Mar 09, 1966   49 y.o.   MRN: [id number] Visit Date: 02/02/2016  Today's Provider: Margaretann LovelessJennifer M Mckinzie Saksa, PA-C   Chief Complaint  Patient presents with  . Diabetes  . Hypertension   Subjective:    HPI      Diabetes Mellitus Type II, Follow-up:   Lab Results  Component Value Date   HGBA1C 7.2 02/02/2016   HGBA1C 6.8 10/28/2015   HGBA1C 11.3 (H) 07/28/2015    Last seen for diabetes 3 months ago.  Management since then includes advising pt to D/C Glipizide. She reports good compliance with treatment. She is having side effects. Pt reports her BS is worsening. Current symptoms include increase appetite and polyuria and have been worsening. Home blood sugar records: postprandial range: 177  Episodes of hypoglycemia? No; not since D/C Glipizide   Most Recent Eye Exam: October 2016 Weight trend: stable Current diet: in general, an "unhealthy" diet Current exercise: none  Pertinent Labs:    Component Value Date/Time   CREATININE 0.70 09/13/2015 1616    Wt Readings from Last 3 Encounters:  02/02/16 179 lb (81.2 kg)  10/28/15 178 lb (80.7 kg)  10/21/15 178 lb (80.7 kg)    ------------------------------------------------------------------------  Hypertension, follow-up:  BP Readings from Last 3 Encounters:  02/02/16 106/82  10/28/15 110/80  10/21/15 120/82    She was last seen for hypertension 3 months ago.  BP at that visit was 110/80. Management since that visit includes none. She reports excellent compliance with treatment. She is not having side effects. She is not exercising. She is adherent to low salt diet.   She is experiencing none.  Patient denies chest pain, chest pressure/discomfort, claudication, dyspnea, exertional chest pressure/discomfort, fatigue, irregular heart beat, lower extremity edema, near-syncope, orthopnea, palpitations and syncope.   Cardiovascular risk factors include diabetes mellitus,  family history of premature cardiovascular disease and hypertension.      Weight trend: stable Wt Readings from Last 3 Encounters:  02/02/16 179 lb (81.2 kg)  10/28/15 178 lb (80.7 kg)  10/21/15 178 lb (80.7 kg)    Current diet: in general, an "unhealthy" diet  ------------------------------------------------------------------------     No Known Allergies Current Meds  Medication Sig  . cetirizine (ZYRTEC ALLERGY) 10 MG tablet Take by mouth daily.   Marland Kitchen. dexlansoprazole (DEXILANT) 60 MG capsule Take 1 capsule (60 mg total) by mouth daily. PT NEEDS FOLLOW UP APPT. NO MORE REFILLS AFTER THIS RX  . escitalopram (LEXAPRO) 20 MG tablet Take 1 tablet (20 mg total) by mouth daily.  . ferrous sulfate 325 (65 FE) MG tablet Take 1 tablet (325 mg total) by mouth daily with breakfast.  . lisinopril-hydrochlorothiazide (PRINZIDE,ZESTORETIC) 10-12.5 MG tablet take 1 tablet by mouth once daily  . metFORMIN (GLUCOPHAGE) 1000 MG tablet Take 1 tablet (1,000 mg total) by mouth 2 (two) times daily with a meal.  . ONETOUCH DELICA LANCETS FINE MISC To check blood sugar once a day DX:  E11.9  . ONETOUCH VERIO test strip TEST twice a day  . sitaGLIPtin (JANUVIA) 100 MG tablet Take 1 tablet (100 mg total) by mouth daily.  . [DISCONTINUED] meloxicam (MOBIC) 15 MG tablet Take 1 tablet (15 mg total) by mouth daily.    Review of Systems  Constitutional: Positive for activity change (less active during summer) and appetite change. Negative for chills, diaphoresis, fatigue, fever and unexpected weight change.  Eyes: Negative for visual disturbance.  Respiratory: Negative for  cough, chest tightness, shortness of breath and wheezing.   Cardiovascular: Negative for chest pain, palpitations and leg swelling.  Gastrointestinal: Negative for abdominal pain.  Endocrine: Positive for polyuria. Negative for polydipsia and polyphagia.  Neurological: Negative for dizziness and headaches.    Social History  Substance  Use Topics  . Smoking status: Never Smoker  . Smokeless tobacco: Never Used  . Alcohol use No   Objective:   BP 106/82 (BP Location: Right Arm, Patient Position: Sitting, Cuff Size: Normal)   Pulse 96   Temp 97.5 F (36.4 C) (Oral)   Resp 16   Wt 179 lb (81.2 kg)   LMP  (Within Months)   BMI 28.89 kg/m   Physical Exam  Constitutional: She appears well-developed and well-nourished. No distress.  Neck: Normal range of motion. Neck supple.  Cardiovascular: Normal rate, regular rhythm and normal heart sounds.  Exam reveals no gallop and no friction rub.   No murmur heard. Pulmonary/Chest: Effort normal and breath sounds normal. No respiratory distress. She has no wheezes. She has no rales.  Musculoskeletal: She exhibits no edema.  Skin: She is not diaphoretic.  Psychiatric: She has a normal mood and affect. Her behavior is normal. Judgment and thought content normal.  Vitals reviewed.     Assessment & Plan:     1. Type 2 diabetes mellitus without complication, unspecified long term insulin use status (HCC) HgBA1c increased to 7.2 from 6.8. Microalbumin is normal at <20. She reports she is to have her annual eye exam in 2 weeks or so. Will add glipizide back to routine. She is to continue metformin  daily (she forgets to take the evening dose) and continue [place] daily. Will check other labs as below and f/u pending labs. I will see her back 3 months for recheck of her HgBA1c. - POCT glycosylated hemoglobin (Hb A1C) - POCT UA - Microalbumin - metFORMIN (GLUCOPHAGE) 1000 MG tablet; Take 1 tablet (1,000 mg total) by mouth daily with breakfast.  Dispense: 60 tablet; Refill: 5 - sitaGLIPtin (JANUVIA) 100 MG tablet; Take 1 tablet (100 mg total) by mouth daily.  Dispense: 30 tablet; Refill: 5 - CBC with Differential - Comprehensive Metabolic Panel (CMET) - glipiZIDE (GLUCOTROL XL) 5 MG 24 hr tablet; Take 1 tablet (5 mg total) by mouth daily with breakfast.  Dispense: 30 tablet;  Refill: 5  2. Elevated LFTs Increase in LFTs from previous lab results. Will order [place] to rule out/rule in fatty liver, mass or cyst as possible cause for elevation in liver enzymes. Depending on cause may refer to GI, discussed that with patient but she wants to hold off on that for now. - US Abdomen Limited RUQ; Future       Margaretann Loveless, PA-C  [facility]

## 2016-02-02 NOTE — Progress Notes (Deleted)
       [name] Female    DOB: 15-Nov-1965   49 y.o.   MRN: [id number] Visit Date: 02/02/2016  Today's Provider: Margaretann LovelessJennifer M Burnette, PA-C   No chief complaint on file.  Subjective:    HPI  Neck Pain: Paitent here to follow up on neck pain.She reports excellent compliance with treatment.She feels that condition is Improved. She is not having side effects.    Diabetes Mellitus Type II, Follow-up:   Lab Results  Component Value Date   HGBA1C 6.8 10/28/2015   HGBA1C 11.3 (H) 07/28/2015    Last seen for diabetes 3 months ago.  Management since then includes None. She reports excellent compliance with treatment. She is not having side effects.  Current symptoms include {Symptoms; diabetes:14075} and have been {Desc; course:15616}. Home blood sugar records: {diabetes glucometry results:16657}  Episodes of hypoglycemia? {yes***/no:17258}   Most Recent Eye Exam: Yes [facility] Weight trend: {trend:16658} Prior visit with dietician: yes -  Current diet: {diet habits:16563} Current exercise: {exercise types:16438}  Pertinent Labs:    Component Value Date/Time   CREATININE 0.70 09/13/2015 1616    Wt Readings from Last 3 Encounters:  10/28/15 178 lb (80.7 kg)  10/21/15 178 lb (80.7 kg)  09/22/15 175 lb (79.4 kg)    ------------------------------------------------------------------------     No Known Allergies No outpatient prescriptions have been marked as taking for the 02/02/16 encounter (Appointment) with Margaretann LovelessJennifer M Burnette, PA-C.    Review of Systems  Social History  Substance Use Topics  . Smoking status: Never Smoker  . Smokeless tobacco: Never Used  . Alcohol use No   Objective:   LMP  (Within Months) Comment: patient believes in the past 9 months  Physical Exam      Assessment & Plan:           Margaretann LovelessJennifer M Burnette, PA-C  [facility]

## 2016-02-02 NOTE — Patient Instructions (Signed)
Diabetes and Exercise Exercising regularly is important. It is not just about losing weight. It has many health benefits, such as:  Improving your overall fitness, flexibility, and endurance.  Increasing your bone density.  Helping with weight control.  Decreasing your body fat.  Increasing your muscle strength.  Reducing stress and tension.  Improving your overall health. People with diabetes who exercise gain additional benefits because exercise:  Reduces appetite.  Improves the body's use of blood sugar (glucose).  Helps lower or control blood glucose.  Decreases blood pressure.  Helps control blood lipids (such as cholesterol and triglycerides).  Improves the body's use of the hormone insulin by:  Increasing the body's insulin sensitivity.  Reducing the body's insulin needs.  Decreases the risk for heart disease because exercising:  Lowers cholesterol and triglycerides levels.  Increases the levels of good cholesterol (such as high-density lipoproteins [HDL]) in the body.  Lowers blood glucose levels. YOUR ACTIVITY PLAN  Choose an activity that you enjoy, and set realistic goals. To exercise safely, you should begin practicing any new physical activity slowly, and gradually increase the intensity of the exercise over time. Your health care provider or diabetes educator can help create an activity plan that works for you. General recommendations include:  Encouraging children to engage in at least 60 minutes of physical activity each day.  Stretching and performing strength training exercises, such as yoga or weight lifting, at least 2 times per week.  Performing a total of at least 150 minutes of moderate-intensity exercise each week, such as brisk walking or water aerobics.  Exercising at least 3 days per week, making sure you allow no more than 2 consecutive days to pass without exercising.  Avoiding long periods of inactivity (90 minutes or more). When you  have to spend an extended period of time sitting down, take frequent breaks to walk or stretch. RECOMMENDATIONS FOR EXERCISING WITH TYPE 1 OR TYPE 2 DIABETES   Check your blood glucose before exercising. If blood glucose levels are greater than 240 mg/dL, check for urine ketones. Do not exercise if ketones are present.  Avoid injecting insulin into areas of the body that are going to be exercised. For example, avoid injecting insulin into:  The arms when playing tennis.  The legs when jogging.  Keep a record of:  Food intake before and after you exercise.  Expected peak times of insulin action.  Blood glucose levels before and after you exercise.  The type and amount of exercise you have done.  Review your records with your health care provider. Your health care provider will help you to develop guidelines for adjusting food intake and insulin amounts before and after exercising.  If you take insulin or oral hypoglycemic agents, watch for signs and symptoms of hypoglycemia. They include:  Dizziness.  Shaking.  Sweating.  Chills.  Confusion.  Drink plenty of water while you exercise to prevent dehydration or heat stroke. Body water is lost during exercise and must be replaced.  Talk to your health care provider before starting an exercise program to make sure it is safe for you. Remember, almost any type of activity is better than none.   This information is not intended to replace advice given to you by your health care provider. Make sure you discuss any questions you have with your health care provider.   Document Released: 09/01/2003 Document Revised: 10/26/2014 Document Reviewed: 11/18/2012 Elsevier Interactive Patient Education 2016 Elsevier Inc.  

## 2016-02-03 LAB — CBC WITH DIFFERENTIAL/PLATELET
Basophils Absolute: 0 10*3/uL (ref 0.0–0.2)
Basos: 1 %
EOS (ABSOLUTE): 0.3 10*3/uL (ref 0.0–0.4)
Eos: 5 %
Hematocrit: 46.3 % (ref 34.0–46.6)
Hemoglobin: 15.5 g/dL (ref 11.1–15.9)
Immature Grans (Abs): 0 10*3/uL (ref 0.0–0.1)
Immature Granulocytes: 0 %
Lymphocytes Absolute: 2.8 10*3/uL (ref 0.7–3.1)
Lymphs: 47 %
MCH: 32.7 pg (ref 26.6–33.0)
MCHC: 33.5 g/dL (ref 31.5–35.7)
MCV: 98 fL — ABNORMAL HIGH (ref 79–97)
Monocytes Absolute: 0.5 10*3/uL (ref 0.1–0.9)
Monocytes: 8 %
Neutrophils Absolute: 2.4 10*3/uL (ref 1.4–7.0)
Neutrophils: 39 %
Platelets: 189 10*3/uL (ref 150–379)
RBC: 4.74 x10E6/uL (ref 3.77–5.28)
RDW: 13.2 % (ref 12.3–15.4)
WBC: 6 10*3/uL (ref 3.4–10.8)

## 2016-02-03 LAB — COMPREHENSIVE METABOLIC PANEL
ALT: 109 IU/L — ABNORMAL HIGH (ref 0–32)
AST: 68 IU/L — ABNORMAL HIGH (ref 0–40)
Albumin/Globulin Ratio: 2.3 — ABNORMAL HIGH (ref 1.2–2.2)
Albumin: 4.8 g/dL (ref 3.5–5.5)
Alkaline Phosphatase: 94 IU/L (ref 39–117)
BUN/Creatinine Ratio: 17 (ref 9–23)
BUN: 11 mg/dL (ref 6–24)
Bilirubin Total: 0.4 mg/dL (ref 0.0–1.2)
CO2: 26 mmol/L (ref 18–29)
Calcium: 9.8 mg/dL (ref 8.7–10.2)
Chloride: 100 mmol/L (ref 96–106)
Creatinine, Ser: 0.66 mg/dL (ref 0.57–1.00)
GFR calc Af Amer: 119 mL/min/{1.73_m2} (ref >59)
GFR calc non Af Amer: 103 mL/min/{1.73_m2} (ref >59)
Globulin, Total: 2.1 g/dL (ref 1.5–4.5)
Glucose: 139 mg/dL — ABNORMAL HIGH (ref 65–99)
Potassium: 4.3 mmol/L (ref 3.5–5.2)
Sodium: 144 mmol/L (ref 134–144)
Total Protein: 6.9 g/dL (ref 6.0–8.5)

## 2016-02-08 ENCOUNTER — Ambulatory Visit: Payer: BC Managed Care – PPO

## 2016-02-10 ENCOUNTER — Ambulatory Visit
Admission: RE | Admit: 2016-02-10 | Discharge: 2016-02-10 | Disposition: A | Discharge status: Home / Self Care | Payer: BC Managed Care – PPO | Source: Ambulatory Visit | Attending: Physician Assistant | Admitting: Physician Assistant

## 2016-02-10 DIAGNOSIS — K760 Fatty (change of) liver, not elsewhere classified: Secondary | ICD-10-CM | POA: Diagnosis not present

## 2016-02-10 DIAGNOSIS — R945 Abnormal results of liver function studies: Secondary | ICD-10-CM

## 2016-02-10 DIAGNOSIS — R7989 Other specified abnormal findings of blood chemistry: Secondary | ICD-10-CM | POA: Insufficient documentation

## 2016-04-18 ENCOUNTER — Other Ambulatory Visit: Payer: Self-pay | Admitting: Gastroenterology

## 2016-04-18 DIAGNOSIS — K2100 Gastro-esophageal reflux disease with esophagitis, without bleeding: Secondary | ICD-10-CM

## 2016-04-26 ENCOUNTER — Telehealth: Payer: Self-pay | Admitting: Gastroenterology

## 2016-04-26 ENCOUNTER — Encounter: Payer: Self-pay | Admitting: Physician Assistant

## 2016-04-26 NOTE — Telephone Encounter (Signed)
Refill for dexilant 60mg  [phone]

## 2016-04-27 ENCOUNTER — Other Ambulatory Visit: Payer: Self-pay

## 2016-04-27 DIAGNOSIS — K2100 Gastro-esophageal reflux disease with esophagitis, without bleeding: Secondary | ICD-10-CM

## 2016-04-27 MED ORDER — DEXLANSOPRAZOLE 60 MG PO CPDR: 1.0000 | DELAYED_RELEASE_CAPSULE | Freq: Every day | ORAL | 2 refills | Status: DC

## 2016-04-27 NOTE — Telephone Encounter (Signed)
[facility]. Pt will need another appt before anymore refills are sent.

## 2016-05-04 ENCOUNTER — Encounter: Payer: Self-pay | Admitting: Physician Assistant

## 2016-05-04 ENCOUNTER — Ambulatory Visit (INDEPENDENT_AMBULATORY_CARE_PROVIDER_SITE_OTHER): Payer: BC Managed Care – PPO | Admitting: Physician Assistant

## 2016-05-04 VITALS — BP 120/82 | HR 76 | Temp 97.7°F | Resp 16 | Wt 182.0 lb

## 2016-05-04 DIAGNOSIS — E119 Type 2 diabetes mellitus without complications: Secondary | ICD-10-CM | POA: Diagnosis not present

## 2016-05-04 DIAGNOSIS — M5412 Radiculopathy, cervical region: Secondary | ICD-10-CM | POA: Diagnosis not present

## 2016-05-04 LAB — POCT GLYCOSYLATED HEMOGLOBIN (HGB A1C)
Est. average glucose Bld gHb Est-mCnc: 151
Hemoglobin A1C: 6.9

## 2016-05-04 MED ORDER — METHYLPREDNISOLONE 4 MG PO TBPK: ORAL_TABLET | ORAL | 0 refills | Status: DC

## 2016-05-04 NOTE — Patient Instructions (Signed)

## 2016-05-04 NOTE — Progress Notes (Signed)
[name] Female    DOB: March 26, 1966   49 y.o.   MRN: [id number] Visit Date: 05/08/2016  Today's Provider: Margaretann LovelessJennifer M Madlynn Lundeen, PA-C   Chief Complaint  Patient presents with  . Diabetes  . Neck Pain   Subjective:    HPI  Diabetes Mellitus Type II, Follow-up:   Lab Results  Component Value Date   HGBA1C 6.9 05/04/2016   HGBA1C 7.2 02/02/2016   HGBA1C 6.8 10/28/2015    Last seen for diabetes 3 months ago.  Management since then includes no changes. She reports excellent compliance with treatment. She is not having side effects.  Current symptoms include none and have been stable. Home blood sugar records: 130 today before lunch  Episodes of hypoglycemia? yes - last night   Current Insulin Regimen:  Most Recent Eye Exam:  Weight trend: increasing steadily Prior visit with dietician: no Current diet: in general, a "healthy" diet   Current exercise: none  Pertinent Labs:    Component Value Date/Time   CREATININE 0.66 02/02/2016 1454    Wt Readings from Last 3 Encounters:  05/04/16 182 lb (82.6 kg)  02/02/16 179 lb (81.2 kg)  10/28/15 178 lb (80.7 kg)    ------------------------------------------------------------------------  Patient c/o neck pain radiating to right arm on and off for about 5-6 week. Patient reports taking Ibuprofen for pain, reports mild relief. Patient reports pain is triggered by using her computer. She has had similar symptoms in the past that responded well to a prednisone taper.    No Known Allergies   Current Outpatient Prescriptions:  .  cetirizine (ZYRTEC ALLERGY) 10 MG tablet, Take by mouth daily. , Disp: , Rfl:  .  dexlansoprazole (DEXILANT) 60 MG capsule, Take 1 capsule (60 mg total) by mouth daily. PT NEEDS FOLLOW UP APPT. NO MORE REFILLS AFTER THIS RX, Disp: 30 capsule, Rfl: 2 .  escitalopram (LEXAPRO) 20 MG tablet, Take 1 tablet (20 mg total) by mouth daily., Disp: 90 tablet, Rfl: 1 .  ferrous sulfate 325  (65 FE) MG tablet, Take 1 tablet (325 mg total) by mouth daily with breakfast., Disp: 90 tablet, Rfl: 3 .  glipiZIDE (GLUCOTROL XL) 5 MG 24 hr tablet, Take 1 tablet (5 mg total) by mouth daily with breakfast., Disp: 30 tablet, Rfl: 5 .  lisinopril-hydrochlorothiazide (PRINZIDE,ZESTORETIC) 10-12.5 MG tablet, take 1 tablet by mouth once daily, Disp: 30 tablet, Rfl: 5 .  metFORMIN (GLUCOPHAGE) 1000 MG tablet, Take 1 tablet (1,000 mg total) by mouth daily with breakfast., Disp: 60 tablet, Rfl: 5 .  ONETOUCH DELICA LANCETS FINE MISC, To check blood sugar once a day DX:  E11.9, Disp: 100 each, Rfl: 5 .  ONETOUCH VERIO test strip, TEST twice a day, Disp: 200 each, Rfl: 1 .  sitaGLIPtin (JANUVIA) 100 MG tablet, Take 1 tablet (100 mg total) by mouth daily., Disp: 30 tablet, Rfl: 5 .  methylPREDNISolone (MEDROL DOSEPAK) 4 MG TBPK tablet, Take as directed on package instructions, Disp: 21 tablet, Rfl: 0  Review of Systems  Constitutional: Negative.   Respiratory: Negative.   Cardiovascular: Negative.   Endocrine: Negative.   Musculoskeletal: Positive for neck pain. Negative for neck stiffness.  Neurological: Positive for numbness (upper extremities; only with using computer).    Social History  Substance Use Topics  . Smoking status: Never Smoker  . Smokeless tobacco: Never Used  . Alcohol use No   Objective:   BP 120/82 (BP Location: Left Arm, Patient Position: Sitting,  Cuff Size: Large)   Pulse 76   Temp 97.7 F (36.5 C) (Oral)   Resp 16   Wt 182 lb (82.6 kg)   LMP  (Within Months)   SpO2 97%   BMI 29.38 kg/m   Physical Exam  Constitutional: She appears well-developed and well-nourished. No distress.  Neck: Normal range of motion. Neck supple. Muscular tenderness present. No spinous process tenderness present. No neck rigidity. No tracheal deviation and normal range of motion present. No thyromegaly present.  Cardiovascular: Normal rate, regular rhythm and normal heart sounds.  Exam  reveals no gallop and no friction rub.   No murmur heard. Pulmonary/Chest: Effort normal and breath sounds normal. No respiratory distress. She has no wheezes. She has no rales.  Musculoskeletal:       Right hand: Normal. Normal sensation noted. Normal strength noted.       Left hand: Normal. Normal sensation noted. Normal strength noted.  Lymphadenopathy:    She has no cervical adenopathy.  Skin: She is not diaphoretic.  Vitals reviewed.     Assessment & Plan:     1. Type 2 diabetes mellitus without complication, unspecified long term insulin use status (HCC) A1c improved to 6.9. Continue lifestyle modifications, glipizide 5 mg, metformin 1000 mg and Januvia 100 mg. I will see her back in 6 months for her diabetes follow-up. - POCT glycosylated hemoglobin (Hb A1C)  2. Cervical radiculopathy Had similar symptoms approximately 6 months ago that responded well to prednisone taper. I will give a prednisone taper as below and she is to call the office if symptoms do not improve. I did advise her that her sugars will increase with the steroid and that if she has sugars consistently running over 500 she is to call the office. - methylPREDNISolone (MEDROL DOSEPAK) 4 MG TBPK tablet; Take as directed on package instructions  Dispense: 21 tablet; Refill: 0       Margaretann LovelessJennifer M Maxx Calaway, PA-C  [facility]

## 2016-05-11 ENCOUNTER — Encounter: Payer: Self-pay | Admitting: Physician Assistant

## 2016-05-11 DIAGNOSIS — M5030 Other cervical disc degeneration, unspecified cervical region: Secondary | ICD-10-CM

## 2016-05-11 DIAGNOSIS — M5412 Radiculopathy, cervical region: Secondary | ICD-10-CM

## 2016-05-11 NOTE — Addendum Note (Signed)
Addended by: Margaretann LovelessBURNETTE, JENNIFER M on: 05/11/2016 05:14 PM   Modules accepted: Orders

## 2016-07-07 ENCOUNTER — Other Ambulatory Visit: Payer: Self-pay | Admitting: Physician Assistant

## 2016-07-07 DIAGNOSIS — I10 Essential (primary) hypertension: Secondary | ICD-10-CM

## 2016-07-17 ENCOUNTER — Other Ambulatory Visit: Payer: Self-pay | Admitting: Gastroenterology

## 2016-07-17 DIAGNOSIS — K2100 Gastro-esophageal reflux disease with esophagitis, without bleeding: Secondary | ICD-10-CM

## 2016-07-19 NOTE — Telephone Encounter (Signed)
Patient left a voice message that she would like to get her Dexilant refilled.

## 2016-08-09 ENCOUNTER — Encounter: Payer: Self-pay | Admitting: Physician Assistant

## 2016-08-09 DIAGNOSIS — D508 Other iron deficiency anemias: Secondary | ICD-10-CM

## 2016-08-09 MED ORDER — FERROUS SULFATE 325 (65 FE) MG PO TABS: 325.0000 mg | ORAL_TABLET | Freq: Every day | ORAL | 3 refills | Status: DC

## 2016-08-14 ENCOUNTER — Telehealth: Payer: Self-pay | Admitting: Gastroenterology

## 2016-08-14 ENCOUNTER — Other Ambulatory Visit: Payer: Self-pay

## 2016-08-14 DIAGNOSIS — K2100 Gastro-esophageal reflux disease with esophagitis, without bleeding: Secondary | ICD-10-CM

## 2016-08-14 MED ORDER — DEXLANSOPRAZOLE 60 MG PO CPDR: 1.0000 | DELAYED_RELEASE_CAPSULE | Freq: Every day | ORAL | 0 refills | Status: DC

## 2016-08-14 NOTE — Telephone Encounter (Signed)
Patient called and [facility] [phone]

## 2016-08-16 NOTE — Telephone Encounter (Signed)
30 day refill sent to pt's pharmacy with no refills as its been 2 years since last appt. Request to call and schedule appt for additional refills.

## 2016-09-10 LAB — HM DIABETES EYE EXAM

## 2016-09-11 ENCOUNTER — Encounter: Payer: Self-pay | Admitting: Physician Assistant

## 2016-09-26 ENCOUNTER — Encounter: Payer: Self-pay | Admitting: Obstetrics & Gynecology

## 2016-09-26 ENCOUNTER — Other Ambulatory Visit: Payer: Self-pay | Admitting: Physician Assistant

## 2016-09-26 DIAGNOSIS — E119 Type 2 diabetes mellitus without complications: Secondary | ICD-10-CM

## 2016-10-01 ENCOUNTER — Ambulatory Visit (INDEPENDENT_AMBULATORY_CARE_PROVIDER_SITE_OTHER): Payer: BC Managed Care – PPO | Admitting: Obstetrics & Gynecology

## 2016-10-01 ENCOUNTER — Encounter: Payer: Self-pay | Admitting: Obstetrics & Gynecology

## 2016-10-01 VITALS — BP 120/70 | HR 79 | Ht 66.0 in | Wt 181.0 lb

## 2016-10-01 DIAGNOSIS — Z0000 Encounter for general adult medical examination without abnormal findings: Secondary | ICD-10-CM

## 2016-10-01 DIAGNOSIS — R3915 Urgency of urination: Secondary | ICD-10-CM | POA: Diagnosis not present

## 2016-10-01 DIAGNOSIS — Z124 Encounter for screening for malignant neoplasm of cervix: Secondary | ICD-10-CM

## 2016-10-01 DIAGNOSIS — Z1231 Encounter for screening mammogram for malignant neoplasm of breast: Secondary | ICD-10-CM | POA: Diagnosis not present

## 2016-10-01 DIAGNOSIS — Z1211 Encounter for screening for malignant neoplasm of colon: Secondary | ICD-10-CM

## 2016-10-01 DIAGNOSIS — Z1239 Encounter for other screening for malignant neoplasm of breast: Secondary | ICD-10-CM

## 2016-10-01 NOTE — Progress Notes (Signed)
HPI:      Misty Graham is a 51 y.o. [phone] who LMP was in the past, she presents today for her annual examination.  The patient has no complaints today. The patient is sexually active.2015 last pap: was normal. The patient is not taking hormone replacement therapy. Patient denies post-menopausal vaginal bleeding.   The patient has regular exercise: yes. Occas urinary urgency, past meds and did not like side effects, not bad enough to want to try other therapies now.  GYN Hx: Last Colonoscopy:3 years ago. Normal.   PMHx: She  has a past medical history of Depression; Diabetes mellitus without complication (HCC); GERD (gastroesophageal reflux disease); Hypertension; and Iron deficiency. Also,  has a past surgical history that includes Cervical discectomy (03/15/2010); Vaginal birth after Cesarean section (1993 and 1997); Cesarean section (1991); Breast surgery (Right); LASIK; Breast biopsy (Right, 4-5 yrs ago); and Colonoscopy., family history includes Allergies in her father; COPD in her father; Coronary artery disease in her father; Healthy in her sister; Heart attack in her paternal grandfather and paternal grandmother; Hypertension in her father; Lung cancer in her mother.,  reports that she has never smoked. She has never used smokeless tobacco. She reports that she does not drink alcohol or use drugs.  She has a current medication list which includes the following prescription(s): cetirizine, dexlansoprazole, escitalopram, ferrous sulfate, glipizide, januvia, lisinopril-hydrochlorothiazide, metformin, onetouch delica lancets fine, onetouch verio, and methylprednisolone. Also, has No Known Allergies.  Review of Systems  Constitutional: Negative for chills, fever and malaise/fatigue.  HENT: Negative for congestion, sinus pain and sore throat.   Eyes: Negative for blurred vision and pain.  Respiratory: Negative for cough and wheezing.   Cardiovascular: Negative for chest pain and leg  swelling.  Gastrointestinal: Negative for abdominal pain, constipation, diarrhea, heartburn, nausea and vomiting.  Genitourinary: Negative for dysuria, frequency, hematuria and urgency.  Musculoskeletal: Negative for back pain, joint pain, myalgias and neck pain.  Skin: Negative for itching and rash.  Neurological: Negative for dizziness, tremors and weakness.  Endo/Heme/Allergies: Does not bruise/bleed easily.  Psychiatric/Behavioral: Negative for depression. The patient is not nervous/anxious and does not have insomnia.     Objective: BP 120/70   Pulse 79   Ht  (1.676 m)   Wt 181 lb (82.1 kg)   LMP 07/18/2010   BMI 29.21 kg/m  Physical Exam  Constitutional: She is oriented to person, place, and time. She appears well-developed and well-nourished. No distress.  Genitourinary: Rectum normal, vagina normal and uterus normal. Pelvic exam was performed with patient supine. There is no rash or lesion on the right labia. There is no rash or lesion on the left labia. Vagina exhibits no lesion. No bleeding in the vagina. Right adnexum does not display mass and does not display tenderness. Left adnexum does not display mass and does not display tenderness. Cervix does not exhibit motion tenderness, lesion, friability or polyp.   Uterus is mobile and midaxial. Uterus is not enlarged or exhibiting a mass.  HENT:  Head: Normocephalic and atraumatic. Head is without laceration.  Right Ear: Hearing normal.  Left Ear: Hearing normal.  Nose: No epistaxis.  No foreign bodies.  Mouth/Throat: Uvula is midline, oropharynx is clear and moist and mucous membranes are normal.  Eyes: Pupils are equal, round, and reactive to light.  Neck: Normal range of motion. Neck supple. No thyromegaly present.  Cardiovascular: Normal rate and regular rhythm.  Exam reveals no gallop and no friction rub.   No murmur  heard. Pulmonary/Chest: Effort normal and breath sounds normal. No respiratory distress. She has no  wheezes. Right breast exhibits no mass, no skin change and no tenderness. Left breast exhibits no mass, no skin change and no tenderness.  Abdominal: Soft. Bowel sounds are normal. She exhibits no distension. There is no tenderness. There is no rebound.  Musculoskeletal: Normal range of motion.  Neurological: She is alert and oriented to person, place, and time. No cranial nerve deficit.  Skin: Skin is warm and dry.  Psychiatric: She has a normal mood and affect. Judgment normal.  Vitals reviewed.   Assessment: Annual Exam 1. Annual physical exam   2. Screening for cervical cancer   3. Screening for breast cancer   4. Screen for colon cancer   5. Urgency of urination    Plan:            1.  Cervical Screening-  Pap smear done today  2. Breast screening- Exam annually and mammogram scheduled  3. Colonoscopy every 10 years, Hemoccult testing after age 57  4. Labs per PCP   5. Counseling for hormonal therapy: none     F/U  Return in about 1 year (around 10/01/2017) for Annual.  Misty Major, MD, [facility], [facility] 10/01/2016  4:38 PM

## 2016-10-04 LAB — IGP, APTIMA HPV
HPV Aptima: NEGATIVE
PAP Smear Comment: 0

## 2016-10-11 ENCOUNTER — Encounter: Payer: Self-pay | Admitting: Obstetrics and Gynecology

## 2016-10-15 ENCOUNTER — Other Ambulatory Visit: Payer: Self-pay | Admitting: Gastroenterology

## 2016-10-15 ENCOUNTER — Encounter: Payer: Self-pay | Admitting: Physician Assistant

## 2016-10-15 DIAGNOSIS — K2100 Gastro-esophageal reflux disease with esophagitis, without bleeding: Secondary | ICD-10-CM

## 2016-10-15 DIAGNOSIS — F339 Major depressive disorder, recurrent, unspecified: Secondary | ICD-10-CM

## 2016-10-15 MED ORDER — ESCITALOPRAM OXALATE 20 MG PO TABS: 20.0000 mg | ORAL_TABLET | Freq: Every day | ORAL | 1 refills | Status: DC

## 2016-10-17 ENCOUNTER — Encounter: Payer: Self-pay | Admitting: Physician Assistant

## 2016-10-17 ENCOUNTER — Ambulatory Visit
Admission: RE | Admit: 2016-10-17 | Discharge: 2016-10-17 | Disposition: A | Discharge status: Home / Self Care | Payer: BC Managed Care – PPO | Source: Ambulatory Visit | Attending: Obstetrics & Gynecology | Admitting: Obstetrics & Gynecology

## 2016-10-17 ENCOUNTER — Other Ambulatory Visit: Payer: Self-pay | Admitting: Physician Assistant

## 2016-10-17 DIAGNOSIS — Z1239 Encounter for other screening for malignant neoplasm of breast: Secondary | ICD-10-CM

## 2016-10-17 DIAGNOSIS — Z1231 Encounter for screening mammogram for malignant neoplasm of breast: Secondary | ICD-10-CM | POA: Diagnosis present

## 2016-10-17 DIAGNOSIS — E119 Type 2 diabetes mellitus without complications: Secondary | ICD-10-CM

## 2016-11-01 ENCOUNTER — Ambulatory Visit (INDEPENDENT_AMBULATORY_CARE_PROVIDER_SITE_OTHER): Payer: BC Managed Care – PPO | Admitting: Physician Assistant

## 2016-11-01 ENCOUNTER — Encounter: Payer: Self-pay | Admitting: Physician Assistant

## 2016-11-01 VITALS — BP 120/88 | HR 92 | Temp 98.1°F | Resp 16 | Wt 186.0 lb

## 2016-11-01 DIAGNOSIS — R5383 Other fatigue: Secondary | ICD-10-CM

## 2016-11-01 DIAGNOSIS — Z13220 Encounter for screening for lipoid disorders: Secondary | ICD-10-CM

## 2016-11-01 DIAGNOSIS — E119 Type 2 diabetes mellitus without complications: Secondary | ICD-10-CM | POA: Diagnosis not present

## 2016-11-01 DIAGNOSIS — Z6830 Body mass index (BMI) 30.0-30.9, adult: Secondary | ICD-10-CM | POA: Diagnosis not present

## 2016-11-01 DIAGNOSIS — Z136 Encounter for screening for cardiovascular disorders: Secondary | ICD-10-CM

## 2016-11-01 DIAGNOSIS — Z713 Dietary counseling and surveillance: Secondary | ICD-10-CM

## 2016-11-01 LAB — POCT GLYCOSYLATED HEMOGLOBIN (HGB A1C)
Est. average glucose Bld gHb Est-mCnc: 160
Hemoglobin A1C: 7.2

## 2016-11-01 MED ORDER — GLIPIZIDE ER 5 MG PO TB24: 5.0000 mg | ORAL_TABLET | Freq: Every day | ORAL | 5 refills | Status: DC

## 2016-11-01 MED ORDER — LIRAGLUTIDE -WEIGHT MANAGEMENT 18 MG/3ML ~~LOC~~ SOPN: 0.6000 mL | PEN_INJECTOR | Freq: Every day | SUBCUTANEOUS | 3 refills | Status: DC

## 2016-11-01 NOTE — Patient Instructions (Signed)
Diabetes Mellitus and Food It is important for you to manage your blood sugar (glucose) level. Your blood glucose level can be greatly affected by what you eat. Eating healthier foods in the appropriate amounts throughout the day at about the same time each day will help you control your blood glucose level. It can also help slow or prevent worsening of your diabetes mellitus. Healthy eating may even help you improve the level of your blood pressure and reach or maintain a healthy weight. General recommendations for healthful eating and cooking habits include:  Eating meals and snacks regularly. Avoid going long periods of time without eating to lose weight.  Eating a diet that consists mainly of plant-based foods, such as fruits, vegetables, nuts, legumes, and whole grains.  Using low-heat cooking methods, such as baking, instead of high-heat cooking methods, such as deep frying. Work with your dietitian to make sure you understand how to use the Nutrition Facts information on food labels. How can food affect me? Carbohydrates  Carbohydrates affect your blood glucose level more than any other type of food. Your dietitian will help you determine how many carbohydrates to eat at each meal and teach you how to count carbohydrates. Counting carbohydrates is important to keep your blood glucose at a healthy level, especially if you are using insulin or taking certain medicines for diabetes mellitus. Alcohol  Alcohol can cause sudden decreases in blood glucose (hypoglycemia), especially if you use insulin or take certain medicines for diabetes mellitus. Hypoglycemia can be a life-threatening condition. Symptoms of hypoglycemia (sleepiness, dizziness, and disorientation) are similar to symptoms of having too much alcohol. If your health care provider has given you approval to drink alcohol, do so in moderation and use the following guidelines:  Women should not have more than one drink per day, and men  should not have more than two drinks per day. One drink is equal to:  12 oz of beer.  5 oz of wine.  1 oz of hard liquor.  Do not drink on an empty stomach.  Keep yourself hydrated. Have water, diet soda, or unsweetened iced tea.  Regular soda, juice, and other mixers might contain a lot of carbohydrates and should be counted. What foods are not recommended? As you make food choices, it is important to remember that all foods are not the same. Some foods have fewer nutrients per serving than other foods, even though they might have the same number of calories or carbohydrates. It is difficult to get your body what it needs when you eat foods with fewer nutrients. Examples of foods that you should avoid that are high in calories and carbohydrates but low in nutrients include:  Trans fats (most processed foods list trans fats on the Nutrition Facts label).  Regular soda.  Juice.  Candy.  Sweets, such as cake, pie, doughnuts, and cookies.  Fried foods. What foods can I eat? Eat nutrient-rich foods, which will nourish your body and keep you healthy. The food you should eat also will depend on several factors, including:  The calories you need.  The medicines you take.  Your weight.  Your blood glucose level.  Your blood pressure level.  Your cholesterol level. You should eat a variety of foods, including:  Protein.  Lean cuts of meat.  Proteins low in saturated fats, such as fish, egg whites, and beans. Avoid processed meats.  Fruits and vegetables.  Fruits and vegetables that may help control blood glucose levels, such as apples, mangoes, and   yams.  Dairy products.  Choose fat-free or low-fat dairy products, such as milk, yogurt, and cheese.  Grains, bread, pasta, and rice.  Choose whole grain products, such as multigrain bread, whole oats, and brown rice. These foods may help control blood pressure.  Fats.  Foods containing healthful fats, such as nuts,  avocado, olive oil, canola oil, and fish. Does everyone with diabetes mellitus have the same meal plan? Because every person with diabetes mellitus is different, there is not one meal plan that works for everyone. It is very important that you meet with a dietitian who will help you create a meal plan that is just right for you. This information is not intended to replace advice given to you by your health care provider. Make sure you discuss any questions you have with your health care provider. Document Released: 03/08/2005 Document Revised: 11/17/2015 Document Reviewed: 05/08/2013 Elsevier Interactive Patient Education  2017 Elsevier [employer]. Liraglutide injection (Weight Management) What is this medicine? LIRAGLUTIDE (LIR a GLOO tide) is used with a reduced calorie diet and exercise to help you lose weight. This medicine may be used for other purposes; ask your health care provider or pharmacist if you have questions. COMMON BRAND NAME(S): Saxenda What should I tell my health care provider before I take this medicine? They need to know if you have any of these conditions: -endocrine tumors (MEN 2) or if someone in your family had these tumors -gallbladder disease -high cholesterol -history of alcohol abuse problem -history of pancreatitis -kidney disease or if you are on dialysis -liver disease -previous swelling of the tongue, face, or lips with difficulty breathing, difficulty swallowing, hoarseness, or tightening of the throat -stomach problems -suicidal thoughts, plans, or attempt; a previous suicide attempt by you or a family member -thyroid cancer or if someone in your family had thyroid cancer -an unusual or allergic reaction to liraglutide, other medicines, foods, dyes, or preservatives -pregnant or trying to get pregnant -breast-feeding How should I use this medicine? This medicine is for injection under the skin of your upper leg, stomach area, or upper arm. You will be taught  how to prepare and give this medicine. Use exactly as directed. Take your medicine at regular intervals. Do not take it more often than directed. It is important that you put your used needles and syringes in a special sharps container. Do not put them in a trash can. If you do not have a sharps container, call your pharmacist or healthcare provider to get one. A special MedGuide will be given to you by the pharmacist with each prescription and refill. Be sure to read this information carefully each time. Talk to your pediatrician regarding the use of this medicine in children. Special care may be needed. Overdosage: If you think you have taken too much of this medicine contact a poison control center or emergency room at once. NOTE: This medicine is only for you. Do not share this medicine with others. What if I miss a dose? If you miss a dose, take it as soon as you can. If it is almost time for your next dose, take only that dose. Do not take double or extra doses. If you miss your dose for 3 days or more, call your doctor or health care professional to talk about how to restart this medicine. What may interact with this medicine? -insulin and other medicines for diabetes This list may not describe all possible interactions. Give your health care provider a list of all  the medicines, herbs, non-prescription drugs, or dietary supplements you use. Also tell them if you smoke, drink alcohol, or use illegal drugs. Some items may interact with your medicine. What should I watch for while using this medicine? Visit your doctor or health care professional for regular checks on your progress. This medicine is intended to be used in addition to a healthy diet and appropriate exercise. The best results are achieved this way. Do not increase or in any way change your dose without consulting your doctor or health care professional. Drink plenty of fluids while taking this medicine. Check with your doctor or  health care professional if you get an attack of severe diarrhea, nausea, and vomiting. The loss of too much body fluid can make it dangerous for you to take this medicine. This medicine may affect blood sugar levels. If you have diabetes, check with your doctor or health care professional before you change your diet or the dose of your diabetic medicine. Patients and their families should watch out for worsening depression or thoughts of suicide. Also watch out for sudden changes in feelings such as feeling anxious, agitated, panicky, irritable, hostile, aggressive, impulsive, severely restless, overly excited and hyperactive, or not being able to sleep. If this happens, especially at the beginning of treatment or after a change in dose, call your health care professional. What side effects may I notice from receiving this medicine? Side effects that you should report to your doctor or health care professional as soon as possible: -allergic reactions like skin rash, itching or hives, swelling of the face, lips, or tongue -breathing problems -diarrhea that continues or is severe -lump or swelling on the neck -severe nausea -signs and symptoms of infection like fever or chills; cough; sore throat; pain or trouble passing urine -signs and symptoms of low blood sugar such as feeling anxious, confusion, dizziness, increased hunger, unusually weak or tired, sweating, shakiness, cold, irritable, headache, blurred vision, fast heartbeat, loss of consciousness -signs and symptoms of kidney injury like trouble passing urine or change in the amount of urine -trouble swallowing -unusual stomach upset or pain -vomiting Side effects that usually do not require medical attention (report to your doctor or health care professional if they continue or are bothersome): -constipation -decreased appetite -diarrhea -fatigue -headache -nausea -pain, redness, or irritation at site where injected -stomach  upset -stuffy or runny nose This list may not describe all possible side effects. Call your doctor for medical advice about side effects. You may report side effects to FDA at 1-800-FDA-1088. Where should I keep my medicine? Keep out of the reach of children. Store unopened pen in a refrigerator between 2 and 8 degrees C (36 and 46 degrees F). Do not freeze or use if the medicine has been frozen. Protect from light and excessive heat. After you first use the pen, it can be stored at room temperature between 15 and 30 degrees C (59 and 86 degrees F) or in a refrigerator. Throw away your used pen after 30 days or after the expiration date, whichever comes first. Do not store your pen with the needle attached. If the needle is left on, medicine may leak from the pen. NOTE: This sheet is a summary. It may not cover all possible information. If you have questions about this medicine, talk to your doctor, pharmacist, or health care provider.  2018 Elsevier/Gold Standard (2016-06-28 14:41:37)

## 2016-11-01 NOTE — Progress Notes (Signed)
[name] Female    DOB: 1965-07-29   51 y.o.   MRN: [id number] Visit Date: 11/01/2016  Today's Provider: Margaretann Loveless, PA-C   Chief Complaint  Patient presents with  . Diabetes  . Foot Injury   Subjective:    Foot Injury   Incident onset: Pt fell on 10/21/2016. The injury mechanism was a fall. The pain is present in the right foot (left wrist). Quality: right foot "numbness" and "soreness" The pain is at a severity of 2/10. The pain is mild. Associated symptoms include a loss of sensation and numbness. Pertinent negatives include no inability to bear weight, loss of motion, muscle weakness or tingling. The symptoms are aggravated by palpation. She has tried ice for the symptoms. The treatment provided no relief.  Pt had the foot and wrist x-rayed at [facility] walk in. Per pt, the xray is WNL.      Diabetes Mellitus Type II, Follow-up:   Lab Results  Component Value Date   HGBA1C 7.2 11/01/2016   HGBA1C 6.9 05/04/2016   HGBA1C 7.2 02/02/2016    Last seen for diabetes 6 months ago.  Management since then includes continuing current medications. She reports fair compliance with treatment. "I haven't been good about it lately". Pt states she will forget to take the medication on the weekends. She is not having side effects.  Current symptoms include increase appetite and visual disturbances and have been worsening (blurry vision is worsening- pt states she feels as if her eyes are dry). Home blood sugar records: not being checked regularly; pt did check BS after lunch yesterday, and it was 188  Episodes of hypoglycemia? yes - "every once in a while." Pt feels shaky and nauseous.   Current Insulin Regimen: N/A Most Recent Eye Exam: April 2018. Weight trend: increasing steadily Prior visit with dietician: yes - [facility] after being diagnosed. Current diet: in general, an "unhealthy" diet Current exercise: none  Pertinent Labs:      Component Value Date/Time   CREATININE 0.66 02/02/2016 1454    Wt Readings from Last 3 Encounters:  11/01/16 186 lb (84.4 kg)  10/01/16 181 lb (82.1 kg)  05/04/16 182 lb (82.6 kg)   ------------------------------------------------------------------------   No Known Allergies   Current Outpatient Prescriptions:  .  cetirizine (ZYRTEC ALLERGY) 10 MG tablet, Take by mouth daily. , Disp: , Rfl:  .  dexlansoprazole (DEXILANT) 60 MG capsule, Take 1 capsule (60 mg total) by mouth daily. PT NEEDS FOLLOW UP APPT. NO MORE REFILLS AFTER THIS RX, Disp: 30 capsule, Rfl: 0 .  escitalopram (LEXAPRO) 20 MG tablet, Take 1 tablet (20 mg total) by mouth at bedtime., Disp: 90 tablet, Rfl: 1 .  ferrous sulfate 325 (65 FE) MG tablet, Take 1 tablet (325 mg total) by mouth daily with breakfast., Disp: 90 tablet, Rfl: 3 .  glipiZIDE (GLUCOTROL XL) 5 MG 24 hr tablet, Take 1 tablet (5 mg total) by mouth daily with breakfast., Disp: 30 tablet, Rfl: 5 .  glucose blood (ONETOUCH VERIO) test strip, To check blood sugar once daily, Disp: 100 each, Rfl: 5 .  JANUVIA 100 MG tablet, take 1 tablet by mouth once daily, Disp: 30 tablet, Rfl: 5 .  lisinopril-hydrochlorothiazide (PRINZIDE,ZESTORETIC) 10-12.5 MG tablet, take 1 tablet by mouth once daily, Disp: 30 tablet, Rfl: 5 .  metFORMIN (GLUCOPHAGE) 1000 MG tablet, take 1 tablet by mouth twice a day with meals, Disp: 60 tablet, Rfl: 5 .  ONETOUCH DELICA LANCETS FINE MISC, To check blood sugar once a day DX:  E11.9, Disp: 100 each, Rfl: 5  Review of Systems  Constitutional: Positive for activity change, appetite change and fatigue. Negative for chills, diaphoresis, fever and unexpected weight change.  Respiratory: Negative for chest tightness, shortness of breath and wheezing.   Cardiovascular: Negative for chest pain, palpitations and leg swelling.  Gastrointestinal: Positive for nausea. Negative for abdominal pain.  Endocrine: Positive for polyphagia. Negative for  polydipsia and polyuria.  Neurological: Positive for numbness. Negative for dizziness, tingling and light-headedness.    Social History  Substance Use Topics  . Smoking status: Never Smoker  . Smokeless tobacco: Never Used  . Alcohol use No   Objective:   BP 120/88 (BP Location: Right Arm, Patient Position: Sitting, Cuff Size: Normal)   Pulse 92   Temp 98.1 F (36.7 C) (Oral)   Resp 16   Wt 186 lb (84.4 kg)   LMP 07/18/2010   BMI 30.02 kg/m  Vitals:   11/01/16 1624  BP: 120/88  Pulse: 92  Resp: 16  Temp: 98.1 F (36.7 C)  TempSrc: Oral  Weight: 186 lb (84.4 kg)     Physical Exam  Constitutional: She appears well-developed and well-nourished. No distress.  Neck: Normal range of motion. Neck supple. No JVD present. No tracheal deviation present. No thyromegaly present.  Cardiovascular: Normal rate, regular rhythm and normal heart sounds.  Exam reveals no gallop and no friction rub.   No murmur heard. Pulmonary/Chest: Effort normal and breath sounds normal. No respiratory distress. She has no wheezes. She has no rales.  Musculoskeletal: She exhibits edema.  Lymphadenopathy:    She has no cervical adenopathy.  Skin: She is not diaphoretic.  Vitals reviewed.  Diabetic Foot Exam - Simple   Simple Foot Form Diabetic Foot exam was performed with the following findings:  Yes 11/01/2016  5:38 PM  Visual Inspection No deformities, no ulcerations, no other skin breakdown bilaterally:  Yes Sensation Testing See comments:  Yes Pulse Check Posterior Tibialis and Dorsalis pulse intact bilaterally:  Yes Comments Decreased sensation of monofilament to lower extremity near ankle but can feel light touch       Assessment & Plan:     1. Type 2 diabetes mellitus without complication, unspecified whether long term insulin use (HCC) A1c up slightly to 7.2. Continue healthy lifestyle modifications. Discussed trying Saxenda for weight loss and some A1c control. Will order as below  and try to get approved through her insurance company. If we can't get Saxenda approved may try Victoza for A1c control, same medicine just lower dosing. Continue glipizide 5mg  daily for now, may d/c once starting saxenda or victoza. Also continue metfomrin 100mg  BID and januvia 100mg . She is to call the office for appt once she has Saxenda to discuss how to titrate and inject. She agrees.  - POCT glycosylated hemoglobin (Hb A1C) - Liraglutide -Weight Management (SAXENDA) 18 MG/3ML SOPN; Inject 0.6 mLs into the skin daily.  Dispense: 3 mL; Refill: 3 - glipiZIDE (GLUCOTROL XL) 5 MG 24 hr tablet; Take 1 tablet (5 mg total) by mouth daily with breakfast.  Dispense: 30 tablet; Refill: 5  2. BMI 30.0-30.9,adult See above medical treatment plan. - Liraglutide -Weight Management (SAXENDA) 18 MG/3ML SOPN; Inject 0.6 mLs into the skin daily.  Dispense: 3 mL; Refill: 3  3. Encounter for lipid screening for cardiovascular disease Will check labs as below and f/u pending results. - Lipid Profile  4. Fatigue, unspecified  type Will check labs as below and f/u pending results. - TSH  5. Encounter for weight loss counseling See above medical treatment plans.       Margaretann LovelessJennifer M Burnette, PA-C  [facility]

## 2016-11-06 ENCOUNTER — Encounter: Payer: Self-pay | Admitting: Physician Assistant

## 2016-11-14 ENCOUNTER — Encounter: Payer: Self-pay | Admitting: Gastroenterology

## 2016-11-14 ENCOUNTER — Ambulatory Visit (INDEPENDENT_AMBULATORY_CARE_PROVIDER_SITE_OTHER): Payer: BC Managed Care – PPO | Admitting: Gastroenterology

## 2016-11-14 VITALS — BP 129/69 | HR 79 | Temp 98.2°F | Ht 66.0 in | Wt 186.0 lb

## 2016-11-14 DIAGNOSIS — K2100 Gastro-esophageal reflux disease with esophagitis, without bleeding: Secondary | ICD-10-CM

## 2016-11-14 DIAGNOSIS — N3281 Overactive bladder: Secondary | ICD-10-CM | POA: Insufficient documentation

## 2016-11-14 MED ORDER — DEXLANSOPRAZOLE 60 MG PO CPDR: 1.0000 | DELAYED_RELEASE_CAPSULE | Freq: Every day | ORAL | 3 refills | Status: DC

## 2016-11-14 NOTE — Patient Instructions (Signed)
Refill for Dexilant has been sent to your mail order pharmacy. If you have any questions or concerns, please contact our office.

## 2016-11-15 ENCOUNTER — Encounter: Payer: Self-pay | Admitting: Physician Assistant

## 2016-11-15 ENCOUNTER — Ambulatory Visit (INDEPENDENT_AMBULATORY_CARE_PROVIDER_SITE_OTHER): Payer: BC Managed Care – PPO | Admitting: Physician Assistant

## 2016-11-15 VITALS — BP 128/86 | HR 75 | Temp 97.5°F | Wt 187.0 lb

## 2016-11-15 DIAGNOSIS — Z6830 Body mass index (BMI) 30.0-30.9, adult: Secondary | ICD-10-CM | POA: Diagnosis not present

## 2016-11-15 DIAGNOSIS — E119 Type 2 diabetes mellitus without complications: Secondary | ICD-10-CM

## 2016-11-15 DIAGNOSIS — E669 Obesity, unspecified: Secondary | ICD-10-CM | POA: Diagnosis not present

## 2016-11-15 LAB — LIPID PANEL
Chol/HDL Ratio: 5.4 ratio — ABNORMAL HIGH (ref 0.0–4.4)
Cholesterol, Total: 227 mg/dL — ABNORMAL HIGH (ref 100–199)
HDL: 42 mg/dL (ref >39)
LDL Calculated: 145 mg/dL — ABNORMAL HIGH (ref 0–99)
Triglycerides: 202 mg/dL — ABNORMAL HIGH (ref 0–149)
VLDL Cholesterol Cal: 40 mg/dL (ref 5–40)

## 2016-11-15 LAB — TSH: TSH: 3.23 u[IU]/mL (ref 0.450–4.500)

## 2016-11-15 MED ORDER — INSULIN PEN NEEDLE 32G X 5 MM MISC: 0 refills | Status: DC

## 2016-11-15 MED ORDER — LIRAGLUTIDE -WEIGHT MANAGEMENT 18 MG/3ML ~~LOC~~ SOPN: 0.6000 mg | PEN_INJECTOR | Freq: Every day | SUBCUTANEOUS | 3 refills | Status: DC

## 2016-11-15 NOTE — Progress Notes (Signed)
[name] Female    DOB: 04-14-1966   51 y.o.   MRN: [id number] Visit Date: 11/15/2016  Today's Provider: Trey Sailors, PA-C   Chief Complaint  Patient presents with  . Follow-up   Subjective:    HPI Patient is here today to discuss how to properly use Saxenda. Patient was prescribed medication on 11/01/2016 by Misty Graham. Patient also needs a RX for needles.  She brings in a [place] pen today. Her starting dose is 0.6mg  daily.    Previous Medications   CETIRIZINE (ZYRTEC ALLERGY) 10 MG TABLET    Take by mouth daily.    DEXLANSOPRAZOLE (DEXILANT) 60 MG CAPSULE    Take 1 capsule (60 mg total) by mouth daily.   ESCITALOPRAM (LEXAPRO) 20 MG TABLET    Take 1 tablet (20 mg total) by mouth at bedtime.   FERROUS SULFATE 325 (65 FE) MG TABLET    Take 1 tablet (325 mg total) by mouth daily with breakfast.   GLIPIZIDE (GLUCOTROL XL) 5 MG 24 HR TABLET    Take 1 tablet (5 mg total) by mouth daily with breakfast.   GLUCOSE BLOOD (ONETOUCH VERIO) TEST STRIP    To check blood sugar once daily   JANUVIA 100 MG TABLET    take 1 tablet by mouth once daily   LIRAGLUTIDE -WEIGHT MANAGEMENT (SAXENDA) 18 MG/3ML SOPN    Inject 0.6 mLs into the skin daily.   LISINOPRIL-HYDROCHLOROTHIAZIDE (PRINZIDE,ZESTORETIC) 10-12.5 MG TABLET    take 1 tablet by mouth once daily   METFORMIN (GLUCOPHAGE) 1000 MG TABLET    take 1 tablet by mouth twice a day with meals   ONETOUCH DELICA LANCETS FINE MISC    To check blood sugar once a day DX:  E11.9    Review of Systems  Constitutional: Negative.   Respiratory: Negative.   Cardiovascular: Negative.     Social History  Substance Use Topics  . Smoking status: Never Smoker  . Smokeless tobacco: Never Used  . Alcohol use No   Objective:   BP 128/86 (BP Location: Right Arm, Patient Position: Sitting, Cuff Size: Normal)   Pulse 75   Temp 97.5 F (36.4 C) (Oral)   Wt 187 lb (84.8 kg)   LMP 07/18/2010   SpO2 97%   BMI 30.18 kg/m    Physical Exam  Constitutional: She is oriented to person, place, and time. She appears well-developed and well-nourished. No distress.  Cardiovascular: Normal rate.   Pulmonary/Chest: Effort normal.  Neurological: She is alert and oriented to person, place, and time.  Skin: Skin is warm and dry.  Psychiatric: She has a normal mood and affect. Her behavior is normal.        Assessment & Plan:     1. BMI 30.0-30.9,adult  Have gone over proper use of Saxenda. Changed Rx sig to say inject 0.6mg  rather than 0.52mL, as mg is the dosing on the pen. Have alerted patient to availability of Saxenda specialist and number to call, as well as online video tutorials. Her starting dose is 0.6mg  daily injected into the skin with new needle. Have ordered needle today. Recommend she follow up with Misty Graham in one week to review usage, go over side effects if any, and talk about titration. Spoke with patient that dose increase is generally 0.6mg  weekly until a max dose of 3mg  but that this should be at the discretion of her provider.   - Liraglutide -Weight Management (SAXENDA) 18 MG/3ML SOPN; Inject 0.6 mg  into the skin daily.  Dispense: 3 mL; Refill: 3 - Insulin Pen Needle 32G X 5 MM MISC; Use one new needle daily for each dose of Saxenda.  Dispense: 90 each; Refill: 0  2. Type 2 diabetes mellitus without complication, unspecified whether long term insulin use (HCC)  - Liraglutide -Weight Management (SAXENDA) 18 MG/3ML SOPN; Inject 0.6 mg into the skin daily.  Dispense: 3 mL; Refill: 3 - Insulin Pen Needle 32G X 5 MM MISC; Use one new needle daily for each dose of Saxenda.  Dispense: 90 each; Refill: 0  Return in about 1 week (around 11/22/2016), or with Misty Graham to go over dose escalation.  The entirety of the information documented in the History of Present Illness, Review of Systems and Physical Exam were personally obtained by me. Portions of this information were initially documented by Misty Graham and  reviewed by me for thoroughness and accuracy.

## 2016-11-15 NOTE — Patient Instructions (Signed)
Diabetes Mellitus and Exercise Exercising regularly is important for your overall health, especially when you have diabetes (diabetes mellitus). Exercising is not only about losing weight. It has many health benefits, such as increasing muscle strength and bone density and reducing body fat and stress. This leads to improved fitness, flexibility, and endurance, all of which result in better overall health. Exercise has additional benefits for people with diabetes, including:  Reducing appetite.  Helping to lower and control blood glucose.  Lowering blood pressure.  Helping to control amounts of fatty substances (lipids) in the blood, such as cholesterol and triglycerides.  Helping the body to respond better to insulin (improving insulin sensitivity).  Reducing how much insulin the body needs.  Decreasing the risk for heart disease by:  Lowering cholesterol and triglyceride levels.  Increasing the levels of good cholesterol.  Lowering blood glucose levels. What is my activity plan? Your health care provider or certified diabetes educator can help you make a plan for the type and frequency of exercise (activity plan) that works for you. Make sure that you:  Do at least 150 minutes of moderate-intensity or vigorous-intensity exercise each week. This could be brisk walking, biking, or water aerobics.  Do stretching and strength exercises, such as yoga or weightlifting, at least 2 times a week.  Spread out your activity over at least 3 days of the week.  Get some form of physical activity every day.  Do not go more than 2 days in a row without some kind of physical activity.  Avoid being inactive for more than 90 minutes at a time. Take frequent breaks to walk or stretch.  Choose a type of exercise or activity that you enjoy, and set realistic goals.  Start slowly, and gradually increase the intensity of your exercise over time. What do I need to know about managing my  diabetes?  Check your blood glucose before and after exercising.  If your blood glucose is higher than 240 mg/dL (13.3 mmol/L) before you exercise, check your urine for ketones. If you have ketones in your urine, do not exercise until your blood glucose returns to normal.  Know the symptoms of low blood glucose (hypoglycemia) and how to treat it. Your risk for hypoglycemia increases during and after exercise. Common symptoms of hypoglycemia can include:  Hunger.  Anxiety.  Sweating and feeling clammy.  Confusion.  Dizziness or feeling light-headed.  Increased heart rate or palpitations.  Blurry vision.  Tingling or numbness around the mouth, lips, or tongue.  Tremors or shakes.  Irritability.  Keep a rapid-acting carbohydrate snack available before, during, and after exercise to help prevent or treat hypoglycemia.  Avoid injecting insulin into areas of the body that are going to be exercised. For example, avoid injecting insulin into:  The arms, when playing tennis.  The legs, when jogging.  Keep records of your exercise habits. Doing this can help you and your health care provider adjust your diabetes management plan as needed. Write down:  Food that you eat before and after you exercise.  Blood glucose levels before and after you exercise.  The type and amount of exercise you have done.  When your insulin is expected to peak, if you use insulin. Avoid exercising at times when your insulin is peaking.  When you start a new exercise or activity, work with your health care provider to make sure the activity is safe for you, and to adjust your insulin, medicines, or food intake as needed.  Drink plenty   of water while you exercise to prevent dehydration or heat stroke. Drink enough fluid to keep your urine clear or pale yellow. This information is not intended to replace advice given to you by your health care provider. Make sure you discuss any questions you have with  your health care provider. Document Released: 09/01/2003 Document Revised: 12/30/2015 Document Reviewed: 11/21/2015 Elsevier Interactive Patient Education  2017 Elsevier Inc.  

## 2016-11-15 NOTE — Progress Notes (Signed)
Primary Care Physician: Misty Loveless, PA-C  Primary Gastroenterologist:  Dr. Midge Graham  Chief Complaint  Patient presents with  . Medication Refill    HPI: Misty Graham is a 52 y.o. female here for follow-up for chronic heartburn. The patient reports that she has very little acid breakthrough she misses her medication. The patient has not seen in sometime and now needs a refill of her medications. There is no report of any dysphagia or black stools bloody stools nausea or vomiting. The patient also denies any unexplained weight loss.  Current Outpatient Prescriptions  Medication Sig Dispense Refill  . cetirizine (ZYRTEC ALLERGY) 10 MG tablet Take by mouth daily.     Marland Kitchen dexlansoprazole (DEXILANT) 60 MG capsule Take 1 capsule (60 mg total) by mouth daily. 90 capsule 3  . escitalopram (LEXAPRO) 20 MG tablet Take 1 tablet (20 mg total) by mouth at bedtime. 90 tablet 1  . ferrous sulfate 325 (65 FE) MG tablet Take 1 tablet (325 mg total) by mouth daily with breakfast. 90 tablet 3  . glipiZIDE (GLUCOTROL XL) 5 MG 24 hr tablet Take 1 tablet (5 mg total) by mouth daily with breakfast. 30 tablet 5  . glucose blood (ONETOUCH VERIO) test strip To check blood sugar once daily 100 each 5  . JANUVIA 100 MG tablet take 1 tablet by mouth once daily 30 tablet 5  . lisinopril-hydrochlorothiazide (PRINZIDE,ZESTORETIC) 10-12.5 MG tablet take 1 tablet by mouth once daily 30 tablet 5  . metFORMIN (GLUCOPHAGE) 1000 MG tablet take 1 tablet by mouth twice a day with meals 60 tablet 5  . ONETOUCH DELICA LANCETS FINE MISC To check blood sugar once a day DX:  E11.9 100 each 5  . Insulin Pen Needle 32G X 5 MM MISC Use one new needle daily for each dose of Saxenda. 90 each 0  . Liraglutide -Weight Management (SAXENDA) 18 MG/3ML SOPN Inject 0.6 mg into the skin daily. 3 mL 3   No current facility-administered medications for this visit.     Allergies as of 11/14/2016  . (No Known Allergies)     ROS:  General: Negative for anorexia, weight loss, fever, chills, fatigue, weakness. ENT: Negative for hoarseness, difficulty swallowing , nasal congestion. CV: Negative for chest pain, angina, palpitations, dyspnea on exertion, peripheral edema.  Respiratory: Negative for dyspnea at rest, dyspnea on exertion, cough, sputum, wheezing.  GI: See history of present illness. GU:  Negative for dysuria, hematuria, urinary incontinence, urinary frequency, nocturnal urination.  Endo: Negative for unusual weight change.    Physical Examination:   BP 129/69   Pulse 79   Temp 98.2 F (36.8 C) (Oral)   Ht 5\' 6"  (1.676 m)   Wt 186 lb (84.4 kg)   LMP 07/18/2010   BMI 30.02 kg/m   General: Well-nourished, well-developed in no acute distress.  Eyes: No icterus. Conjunctivae pink. Mouth: Oropharyngeal mucosa moist and pink , no lesions erythema or exudate. Lungs: Clear to auscultation bilaterally. Non-labored. Heart: Regular rate and rhythm, no murmurs rubs or gallops.  Abdomen: Bowel sounds are normal, nontender, nondistended, no hepatosplenomegaly or masses, no abdominal bruits or hernia , no rebound or guarding.   Extremities: No lower extremity edema. No clubbing or deformities. Neuro: Alert and oriented x 3.  Grossly intact. Skin: Warm and dry, no jaundice.   Psych: Alert and cooperative, normal mood and affect.  Labs:    Imaging Studies: Mm Screening Breast Tomo Bilateral  Result Date: 10/18/2016 CLINICAL DATA:  Screening. EXAM: 2D DIGITAL SCREENING BILATERAL MAMMOGRAM WITH CAD AND ADJUNCT TOMO COMPARISON:  Previous exam(s). ACR Breast Density Category b: There are scattered areas of fibroglandular density. FINDINGS: There are no findings suspicious for malignancy. Images were processed with CAD. IMPRESSION: No mammographic evidence of malignancy. A result letter of this screening mammogram will be mailed directly to the patient. RECOMMENDATION: Screening mammogram in one year.  (Code:[id number]-01Y) BI-RADS CATEGORY  1: Negative. Electronically Signed   By: Frederico HammanMichelle  Collins M.D.   On: 10/18/2016 09:54    Assessment and Plan:   Misty Graham is a 51 y.o. y/o female who needs a refill of her medication for reflux. The patient has been doing well without any complaints. The patient will have her medication refill the next year.    Misty Miniumarren Earlie Arciga, MD. Clementeen GrahamFACG   Note: This dictation was prepared with Dragon dictation along with smaller phrase technology. Any transcriptional errors that result from this process are unintentional.

## 2016-11-20 ENCOUNTER — Telehealth: Payer: Self-pay

## 2016-11-20 NOTE — Telephone Encounter (Signed)
[id number] 

## 2016-11-20 NOTE — Telephone Encounter (Signed)
-----   Message from Margaretann LovelessJennifer M Burnette, [place]PA-C sent at 11/20/2016  2:00 PM EDT ----- Cholesterol is borderline high but ASCVD 1- yr risks is at 5.65%. Do not need to consider cholesterol lowering medication until ASCVD risk is > 7.5%. Plus with you making lifestyle changes and working on weight loss this may improve cholesterol as well. Thyroid WNL.

## 2016-11-21 NOTE — Telephone Encounter (Signed)
Patient advised as directed below. She will look her numbers up on mychart.  Thanks,  -Cosima Prentiss

## 2016-11-21 NOTE — Telephone Encounter (Signed)
LMTCB

## 2017-03-25 ENCOUNTER — Other Ambulatory Visit: Payer: Self-pay | Admitting: Physician Assistant

## 2017-03-25 DIAGNOSIS — F339 Major depressive disorder, recurrent, unspecified: Secondary | ICD-10-CM

## 2017-04-08 ENCOUNTER — Other Ambulatory Visit: Payer: Self-pay | Admitting: Physician Assistant

## 2017-04-08 DIAGNOSIS — I10 Essential (primary) hypertension: Secondary | ICD-10-CM

## 2017-04-24 ENCOUNTER — Other Ambulatory Visit: Payer: Self-pay | Admitting: Physician Assistant

## 2017-04-24 DIAGNOSIS — E119 Type 2 diabetes mellitus without complications: Secondary | ICD-10-CM

## 2017-05-11 ENCOUNTER — Other Ambulatory Visit: Payer: Self-pay | Admitting: Physician Assistant

## 2017-05-11 DIAGNOSIS — E119 Type 2 diabetes mellitus without complications: Secondary | ICD-10-CM

## 2017-05-11 DIAGNOSIS — I10 Essential (primary) hypertension: Secondary | ICD-10-CM

## 2017-06-19 ENCOUNTER — Other Ambulatory Visit: Payer: Self-pay | Admitting: Physician Assistant

## 2017-06-19 DIAGNOSIS — E119 Type 2 diabetes mellitus without complications: Secondary | ICD-10-CM

## 2017-06-19 DIAGNOSIS — I10 Essential (primary) hypertension: Secondary | ICD-10-CM

## 2017-07-18 ENCOUNTER — Encounter: Payer: Self-pay | Admitting: Physician Assistant

## 2017-07-23 ENCOUNTER — Other Ambulatory Visit: Payer: Self-pay | Admitting: Physician Assistant

## 2017-07-23 DIAGNOSIS — D508 Other iron deficiency anemias: Secondary | ICD-10-CM

## 2017-09-30 ENCOUNTER — Telehealth: Payer: Self-pay | Admitting: Physician Assistant

## 2017-09-30 NOTE — Telephone Encounter (Signed)
[facility] 10[facility]18[facility]18

## 2017-09-30 NOTE — Telephone Encounter (Signed)
0 pages - [facility]

## 2017-10-21 ENCOUNTER — Other Ambulatory Visit: Payer: Self-pay | Admitting: Physician Assistant

## 2017-10-21 ENCOUNTER — Other Ambulatory Visit: Payer: Self-pay | Admitting: Gastroenterology

## 2017-10-21 DIAGNOSIS — F339 Major depressive disorder, recurrent, unspecified: Secondary | ICD-10-CM

## 2017-10-21 DIAGNOSIS — K2100 Gastro-esophageal reflux disease with esophagitis, without bleeding: Secondary | ICD-10-CM

## 2017-10-22 ENCOUNTER — Other Ambulatory Visit: Payer: Self-pay

## 2017-10-22 ENCOUNTER — Telehealth: Payer: Self-pay | Admitting: Gastroenterology

## 2017-10-22 DIAGNOSIS — K2100 Gastro-esophageal reflux disease with esophagitis, without bleeding: Secondary | ICD-10-CM

## 2017-10-22 MED ORDER — DEXLANSOPRAZOLE 60 MG PO CPDR: 1.0000 | DELAYED_RELEASE_CAPSULE | Freq: Every day | ORAL | 3 refills | Status: DC

## 2017-10-22 NOTE — Telephone Encounter (Signed)
Pt left vm stating she was notified by pharmacy that her rx for dexalint was denied please call pt

## 2017-10-22 NOTE — Telephone Encounter (Signed)
Pt advised refill for Dexilant has been sent to her mail order pharmacy.

## 2017-11-20 ENCOUNTER — Other Ambulatory Visit: Payer: Self-pay | Admitting: Physician Assistant

## 2017-11-20 DIAGNOSIS — E119 Type 2 diabetes mellitus without complications: Secondary | ICD-10-CM

## 2017-12-09 DIAGNOSIS — E119 Type 2 diabetes mellitus without complications: Secondary | ICD-10-CM | POA: Insufficient documentation

## 2017-12-09 DIAGNOSIS — F325 Major depressive disorder, single episode, in full remission: Secondary | ICD-10-CM | POA: Insufficient documentation

## 2017-12-09 DIAGNOSIS — E7800 Pure hypercholesterolemia, unspecified: Secondary | ICD-10-CM | POA: Insufficient documentation

## 2017-12-11 ENCOUNTER — Other Ambulatory Visit: Payer: Self-pay | Admitting: Physician Assistant

## 2017-12-11 DIAGNOSIS — E119 Type 2 diabetes mellitus without complications: Secondary | ICD-10-CM

## 2017-12-11 DIAGNOSIS — I10 Essential (primary) hypertension: Secondary | ICD-10-CM

## 2017-12-23 ENCOUNTER — Encounter: Payer: Self-pay | Admitting: Obstetrics & Gynecology

## 2017-12-23 ENCOUNTER — Ambulatory Visit (INDEPENDENT_AMBULATORY_CARE_PROVIDER_SITE_OTHER): Payer: BC Managed Care – PPO | Admitting: Obstetrics & Gynecology

## 2017-12-23 VITALS — BP 110/80 | Ht 66.0 in | Wt 181.0 lb

## 2017-12-23 DIAGNOSIS — Z1231 Encounter for screening mammogram for malignant neoplasm of breast: Secondary | ICD-10-CM | POA: Diagnosis not present

## 2017-12-23 DIAGNOSIS — Z01411 Encounter for gynecological examination (general) (routine) with abnormal findings: Secondary | ICD-10-CM | POA: Diagnosis not present

## 2017-12-23 DIAGNOSIS — N3281 Overactive bladder: Secondary | ICD-10-CM

## 2017-12-23 DIAGNOSIS — Z1239 Encounter for other screening for malignant neoplasm of breast: Secondary | ICD-10-CM

## 2017-12-23 DIAGNOSIS — Z1211 Encounter for screening for malignant neoplasm of colon: Secondary | ICD-10-CM

## 2017-12-23 DIAGNOSIS — R6882 Decreased libido: Secondary | ICD-10-CM | POA: Diagnosis not present

## 2017-12-23 DIAGNOSIS — Z0000 Encounter for general adult medical examination without abnormal findings: Secondary | ICD-10-CM

## 2017-12-23 MED ORDER — MIRABEGRON ER 25 MG PO TB24: 25.0000 mg | ORAL_TABLET | Freq: Every day | ORAL | 11 refills | Status: DC

## 2017-12-23 MED ORDER — ESTRADIOL 10 MCG VA INST: 1.0000 | VAGINAL_INSERT | Freq: Every day | VAGINAL | 0 refills | Status: DC

## 2017-12-23 MED ORDER — ESTRADIOL 10 MCG VA INST: 1.0000 | VAGINAL_INSERT | VAGINAL | 11 refills | Status: DC

## 2017-12-23 MED ORDER — PROGESTERONE MICRONIZED 200 MG PO CAPS: ORAL_CAPSULE | ORAL | 11 refills | Status: DC

## 2017-12-23 NOTE — Patient Instructions (Addendum)
PAP every three years Mammogram every year    Call [phone] to schedule at [facility] Colonoscopy every 10 years Labs yearly (with PCP)   Menopause and Hormone Replacement Therapy What is hormone replacement therapy? Hormone replacement therapy (HRT) is the use of artificial (synthetic) hormones to replace hormones that your body stops producing during menopause. Menopause is the normal time of life when menstrual periods stop completely and the ovaries stop producing the female hormones estrogen and progesterone. This lack of hormones can affect your health and cause undesirable symptoms. HRT can relieve some of those symptoms. What are my options for HRT? HRT may consist of the synthetic hormones estrogen and progestin, or it may consist of only estrogen (estrogen-only therapy). You and your health care provider will decide which form of HRT is best for you. If you choose to be on HRT and you have a uterus, estrogen and progestin are usually prescribed. Estrogen-only therapy is used for women who do not have a uterus. Possible options for taking HRT include:  Pills.  Patches.  Gels.  Sprays.  Vaginal cream.  Vaginal rings.  Vaginal inserts.  The amount of hormone(s) that you take and how long you take the hormone(s) varies depending on your individual health. It is important to:  Begin HRT with the lowest possible dosage.  Stop HRT as soon as your health care provider tells you to stop.  Work with your health care provider so that you feel informed and comfortable with your decisions.  What are the benefits of HRT? HRT can reduce the frequency and severity of menopausal symptoms. Benefits of HRT vary depending on the menopausal symptoms that you have, the severity of your symptoms, and your overall health. HRT may help to improve the following menopausal symptoms:  Hot flashes and night sweats. These are sudden feelings of heat that spread over the face and body. The  skin may turn red, like a blush. Night sweats are hot flashes that happen while you are sleeping or trying to sleep.  Bone loss (osteoporosis). The body loses calcium more quickly after menopause, causing the bones to become weaker. This can increase the risk for bone breaks (fractures).  Vaginal dryness. The lining of the vagina can become thin and dry, which can cause pain during sexual intercourse or cause infection, burning, or itching.  Urinary tract infections.  Urinary incontinence. This is a decreased ability to control when you urinate.  Irritability.  Short-term memory problems.  What are the risks of HRT? Risks of HRT vary depending on your individual health and medical history. Risks of HRT also depend on whether you receive both estrogen and progestin or you receive estrogen only.HRT may increase the risk of:  Spotting. This is when a small amount of bloodleaks from the vagina unexpectedly.  Endometrial cancer. This cancer is in the lining of the uterus (endometrium).  Breast cancer.  Increased density of breast tissue. This can make it harder to find breast cancer on a breast X-ray (mammogram).  Stroke.  Heart attack.  Blood clots.  Gallbladder disease.  Risks of HRT can increase if you have any of the following conditions:  Endometrial cancer.  Liver disease.  Heart disease.  Breast cancer.  History of blood clots.  History of stroke.  How should I care for myself while I am on HRT?  Take over-the-counter and prescription medicines only as told by your health care provider.  Get mammograms, pelvic exams, and medical checkups as often as told by  your health care provider.  Have Pap tests done as often as told by your health care provider. A Pap test is sometimes called a Pap smear. It is a screening test that is used to check for signs of cancer of the cervix and vagina. A Pap test can also identify the presence of infection or precancerous  changes. Pap tests may be done: ? Every 3 years, starting at age 53. ? Every 5 years, starting after age 53, in combination with testing for human papillomavirus (HPV). ? More often or less often depending on other medical conditions you have, your age, and other risk factors.  It is your responsibility to get your Pap test results. Ask your health care provider or the department performing the test when your results will be ready.  Keep all follow-up visits as told by your health care provider. This is important. When should I seek medical care? Talk with your health care provider if:  You have any of these: ? Pain or swelling in your legs. ? Shortness of breath. ? Chest pain. ? Lumps or changes in your breasts or armpits. ? Slurred speech. ? Pain, burning, or bleeding when you urine.  You develop any of these: ? Unusual vaginal bleeding. ? Dizziness or headaches. ? Weakness or numbness in any part of your arms or legs. ? Pain in your abdomen.  This information is not intended to replace advice given to you by your health care provider. Make sure you discuss any questions you have with your health care provider. Document Released: 03/10/2003 Document Revised: 05/08/2016 Document Reviewed: 12/13/2014 Elsevier Interactive Patient Education  2017 [employer]

## 2017-12-23 NOTE — Progress Notes (Signed)
HPI:      [name] is a 52 y.o. [phone] [id number] who LMP was in the past, she presents today for her annual examination.  The patient c/o worsening OAB again (past med use but not recently) and decreased libido today.  No other menopause sx's.  No period or bleeding in >2 years. The patient is sexually active. Herlast pap: approximate date 2018 and was normal and last mammogram: approximate date 2018 and was normal.  The patient does perform self breast exams.  There is no notable family history of breast or ovarian cancer in her family. The patient is not taking hormone replacement therapy. Patient denies post-menopausal vaginal bleeding.   The patient has regular exercise: yes. The patient denies current symptoms of depression.    GYN Hx: Last Colonoscopy:4 years ago. Normal.  Last DEXA: never ago.    PMHx: Past Medical History:  Diagnosis Date  . Depression   . Diabetes mellitus without complication (HCC)   . Family history of ovarian cancer    Pt is My Risk neg 5/16 except NBN VUS  . GERD (gastroesophageal reflux disease)   . Hypertension   . Iron deficiency    Past Surgical History:  Procedure Laterality Date  . BREAST BIOPSY Right 4-5 yrs ago   core - neg with clip  . BREAST SURGERY Right    biopsy; Dr. Evette CristalSankar  . CERVICAL DISCECTOMY  03/15/2010  . CESAREAN SECTION  1991  . COLONOSCOPY    . LASIK    . VAGINAL BIRTH AFTER CESAREAN SECTION  1993 and 1997   Family History  Problem Relation Age of Onset  . Lung cancer Mother   . COPD Father   . Hypertension Father   . Allergies Father   . Coronary artery disease Father   . Healthy Sister   . Heart attack Paternal Grandmother   . Ovarian cancer Paternal Grandmother   . Heart attack Paternal Grandfather   . Breast cancer Neg Hx    Social History   Tobacco Use  . Smoking status: Never Smoker  . Smokeless tobacco: Never Used  Substance Use Topics  . Alcohol use: No  . Drug use: No    Current Outpatient  Medications:  .  cetirizine (ZYRTEC ALLERGY) 10 MG tablet, Take by mouth daily. , Disp: , Rfl:  .  dexlansoprazole (DEXILANT) 60 MG capsule, Take 1 capsule (60 mg total) by mouth daily., Disp: 90 capsule, Rfl: 3 .  escitalopram (LEXAPRO) 20 MG tablet, Take 1 tablet (20 mg total) by mouth at bedtime., Disp: 90 tablet, Rfl: 1 .  FEROSUL 325 (65 Fe) MG tablet, Take 1 tablet (325 mg total) by mouth daily with breakfast., Disp: 90 tablet, Rfl: 2 .  glipiZIDE (GLUCOTROL XL) 5 MG 24 hr tablet, Take 1 tablet (5 mg total) by mouth daily with breakfast., Disp: 30 tablet, Rfl: 4 .  glucose blood (ONETOUCH VERIO) test strip, To check blood sugar once daily, Disp: 100 each, Rfl: 5 .  Insulin Pen Needle 32G X 5 MM MISC, Use one new needle daily for each dose of Saxenda., Disp: 90 each, Rfl: 0 .  JANUVIA 100 MG tablet, TAKE 1 TABLET BY MOUTH ONCE DAILY, Disp: 90 tablet, Rfl: 1 .  Liraglutide -Weight Management (SAXENDA) 18 MG/3ML SOPN, Inject 0.6 mg into the skin daily., Disp: 3 mL, Rfl: 3 .  lisinopril-hydrochlorothiazide (PRINZIDE,ZESTORETIC) 10-12.5 MG tablet, TAKE 1 TABLET BY MOUTH EVERY DAY, Disp: 90 tablet, Rfl: 1 .  metFORMIN (GLUCOPHAGE)  1000 MG tablet, TAKE 1 TABLET BY MOUTH TWICE A DAY WITH MEALS., Disp: 180 tablet, Rfl: 1 .  ONETOUCH DELICA LANCETS FINE MISC, To check blood sugar once a day DX:  E11.9, Disp: 100 each, Rfl: 5 .  Estradiol (IMVEXXY MAINTENANCE PACK) 10 MCG INST, Place 1 tablet vaginally 2 (two) times a week., Disp: 8 each, Rfl: 11 .  Estradiol (IMVEXXY STARTER PACK) 10 MCG INST, Place 1 tablet vaginally daily. For 2 weeks then twice weekly, Disp: 18 each, Rfl: 0 .  mirabegron ER (MYRBETRIQ) 25 MG TB24 tablet, Take 1 tablet (25 mg total) by mouth daily., Disp: 30 tablet, Rfl: 11 .  progesterone (PROMETRIUM) 200 MG capsule, Place one capsule vaginally at bedtime, Disp: 30 capsule, Rfl: 11 Allergies: Patient has no known allergies.  Review of Systems  Constitutional: Negative for chills,  fever and malaise/fatigue.  HENT: Negative for congestion, sinus pain and sore throat.   Eyes: Negative for blurred vision and pain.  Respiratory: Negative for cough and wheezing.   Cardiovascular: Negative for chest pain and leg swelling.  Gastrointestinal: Negative for abdominal pain, constipation, diarrhea, heartburn, nausea and vomiting.  Genitourinary: Positive for frequency and urgency. Negative for dysuria and hematuria.  Musculoskeletal: Negative for back pain, joint pain, myalgias and neck pain.  Skin: Negative for itching and rash.  Neurological: Negative for dizziness, tremors and weakness.  Endo/Heme/Allergies: Does not bruise/bleed easily.  Psychiatric/Behavioral: Negative for depression. The patient is not nervous/anxious and does not have insomnia.     Objective: BP 110/80   Ht 5\' 6"  (1.676 m)   Wt 181 lb (82.1 kg)   LMP 07/18/2010   BMI 29.21 kg/m   Filed Weights   12/23/17 0955  Weight: 181 lb (82.1 kg)   Body mass index is 29.21 kg/m. Physical Exam  Constitutional: She is oriented to person, place, and time. She appears well-developed and well-nourished. No distress.  Genitourinary: Rectum normal, vagina normal and uterus normal. Pelvic exam was performed with patient supine. There is no rash or lesion on the right labia. There is no rash or lesion on the left labia. Vagina exhibits no lesion. No bleeding in the vagina. Right adnexum does not display mass and does not display tenderness. Left adnexum does not display mass and does not display tenderness. Cervix does not exhibit motion tenderness, lesion, friability or polyp.   Uterus is mobile and midaxial. Uterus is not enlarged or exhibiting a mass.  HENT:  Head: Normocephalic and atraumatic. Head is without laceration.  Right Ear: Hearing normal.  Left Ear: Hearing normal.  Nose: No epistaxis.  No foreign bodies.  Mouth/Throat: Uvula is midline, oropharynx is clear and moist and mucous membranes are normal.    Eyes: Pupils are equal, round, and reactive to light.  Neck: Normal range of motion. Neck supple. No thyromegaly present.  Cardiovascular: Normal rate and regular rhythm. Exam reveals no gallop and no friction rub.  No murmur heard. Pulmonary/Chest: Effort normal and breath sounds normal. No respiratory distress. She has no wheezes. Right breast exhibits no mass, no skin change and no tenderness. Left breast exhibits no mass, no skin change and no tenderness.  Abdominal: Soft. Bowel sounds are normal. She exhibits no distension. There is no tenderness. There is no rebound.  Musculoskeletal: Normal range of motion.  Neurological: She is alert and oriented to person, place, and time. No cranial nerve deficit.  Skin: Skin is warm and dry.  Psychiatric: She has a normal mood and affect. Judgment normal.  Vitals reviewed.  Assessment: Annual Exam 1. Annual physical exam   2. Screening for breast cancer   3. Screen for colon cancer   4. OAB 5. Libido decreased  Plan:            1.  Cervical Screening-  Pap smear schedule reviewed with patient  2. Breast screening- Exam annually and mammogram scheduled  3. Colonoscopy every 10 years, Hemoccult testing after age 10  4. Labs managed by PCP  5. Counseling for hormonal therapy:  Options for Libido discussed, may be influenced by hormone status.  No reported stress, pain, or other concerns.  Will start w vag ERT w Prometrium as protective effect.   HRT- Imvexxy (vag) and Prometrium. I have discussed HRT with the patient in detail.  The risk/benefits of it were reviewed.  She understands that during menopause Estrogen decreases dramatically and that this results in an increased risk of cardiovascular disease as well as osteoporosis.  We have also discussed the fact that hot flashes often result from a decrease in Estrogen, and that by replacing Estrogen, they can often be alleviated.  We have discussed skin, vaginal and urinary tract changes that  may also take place from this drop in Estrogen.  Emotional changes have also been linked to Estrogen and we have briefly discussed this.  The benefits of HRT including decrease in hot flashes, vaginal dryness, and osteoporosis were discussed.  The emotional benefit and a possible change in her cardiovascular risk profile was also reviewed.  The risks associated with Hormone Replacement Therapy were also reviewed.  The use of unopposed Estrogen and its relationship to endometrial cancer was discussed.  The addition of Progesterone and its beneficial effect on endometrial cancer was also noted.  The fact that there has been no consistent definitive studies showing an increase in breast cancer in women who use HRT was discussed with the patient.  The possible side effects including breast tenderness, fluid retention, mood changes and vaginal bleeding were discussed.  The patient was informed that this is an elective medication and that she may choose not to take Hormone Replacement Therapy.  Literature on HRT was given, and I believe that after answering all of the patient's questions, she has an adequate and informed understanding of HRT.  Special emphasis on the WHI study, as well as several studies since that pertaining to the risks and benefits of estrogen replacement therapy were compared.  The possible limitations of these studies were discussed including the age stratification of the WHI study.  The possible role of Progesterone in these studies was discussed in detail.  I believe that the patient has an informed knowledge of the risks and benefits of HRT.  I have specifically discussed WHI findings and current updates.  Different type of hormone formulation and methods of taking hormone replacement therapy discussed.   6. OAB, restart Myrbetriq, pros and cons and SE discussed.    F/U  Return in about 3 months (around 03/25/2018) for Follow up, and Annual when due.  [name], MD, [facility],  [facility] 12/23/2017  10:38 AM

## 2018-01-05 ENCOUNTER — Encounter: Payer: Self-pay | Admitting: Obstetrics & Gynecology

## 2018-01-06 NOTE — Telephone Encounter (Signed)
Cancel upcoming follow up appt and put in for one year Annual exam.  Thx.

## 2018-01-08 LAB — FECAL OCCULT BLOOD, IMMUNOCHEMICAL: Fecal Occult Bld: NEGATIVE

## 2018-01-09 ENCOUNTER — Ambulatory Visit
Admission: RE | Admit: 2018-01-09 | Discharge: 2018-01-09 | Disposition: A | Discharge status: Home / Self Care | Payer: BC Managed Care – PPO | Source: Ambulatory Visit | Attending: Obstetrics & Gynecology | Admitting: Obstetrics & Gynecology

## 2018-01-09 DIAGNOSIS — Z1231 Encounter for screening mammogram for malignant neoplasm of breast: Secondary | ICD-10-CM | POA: Insufficient documentation

## 2018-01-09 DIAGNOSIS — Z1239 Encounter for other screening for malignant neoplasm of breast: Secondary | ICD-10-CM

## 2018-01-14 ENCOUNTER — Other Ambulatory Visit: Payer: Self-pay | Admitting: Physician Assistant

## 2018-01-14 DIAGNOSIS — E119 Type 2 diabetes mellitus without complications: Secondary | ICD-10-CM

## 2018-01-14 NOTE — Telephone Encounter (Signed)
Patient needs labs and appt; had cpe at [facility]

## 2018-02-26 ENCOUNTER — Other Ambulatory Visit: Payer: Self-pay | Admitting: Physician Assistant

## 2018-02-26 DIAGNOSIS — E119 Type 2 diabetes mellitus without complications: Secondary | ICD-10-CM

## 2018-03-27 ENCOUNTER — Ambulatory Visit: Payer: BC Managed Care – PPO | Admitting: Obstetrics & Gynecology

## 2018-04-17 ENCOUNTER — Other Ambulatory Visit: Payer: Self-pay | Admitting: Physician Assistant

## 2018-04-17 DIAGNOSIS — E119 Type 2 diabetes mellitus without complications: Secondary | ICD-10-CM

## 2018-04-17 DIAGNOSIS — D508 Other iron deficiency anemias: Secondary | ICD-10-CM

## 2018-04-17 DIAGNOSIS — F339 Major depressive disorder, recurrent, unspecified: Secondary | ICD-10-CM

## 2018-06-26 ENCOUNTER — Other Ambulatory Visit: Payer: Self-pay | Admitting: Family Medicine

## 2018-06-26 DIAGNOSIS — F339 Major depressive disorder, recurrent, unspecified: Secondary | ICD-10-CM

## 2018-08-16 ENCOUNTER — Other Ambulatory Visit: Payer: Self-pay | Admitting: Family Medicine

## 2018-08-16 DIAGNOSIS — F339 Major depressive disorder, recurrent, unspecified: Secondary | ICD-10-CM

## 2018-09-16 ENCOUNTER — Other Ambulatory Visit: Payer: Self-pay | Admitting: Gastroenterology

## 2018-09-16 ENCOUNTER — Other Ambulatory Visit: Payer: Self-pay | Admitting: Family Medicine

## 2018-09-16 DIAGNOSIS — D508 Other iron deficiency anemias: Secondary | ICD-10-CM

## 2018-09-16 DIAGNOSIS — K2100 Gastro-esophageal reflux disease with esophagitis, without bleeding: Secondary | ICD-10-CM

## 2018-10-11 ENCOUNTER — Other Ambulatory Visit: Payer: Self-pay | Admitting: Physician Assistant

## 2018-10-11 DIAGNOSIS — E119 Type 2 diabetes mellitus without complications: Secondary | ICD-10-CM

## 2018-12-14 IMAGING — MG MM DIGITAL SCREENING BILAT W/ TOMO W/ CAD
8 of 13 series · 8 of 29 positions shown · non-contrast
Comparison: Previous exam(s).

CLINICAL DATA: Screening.

EXAM:
2D DIGITAL SCREENING BILATERAL MAMMOGRAM WITH CAD AND ADJUNCT TOMO

[R MLO (1 of 2)]
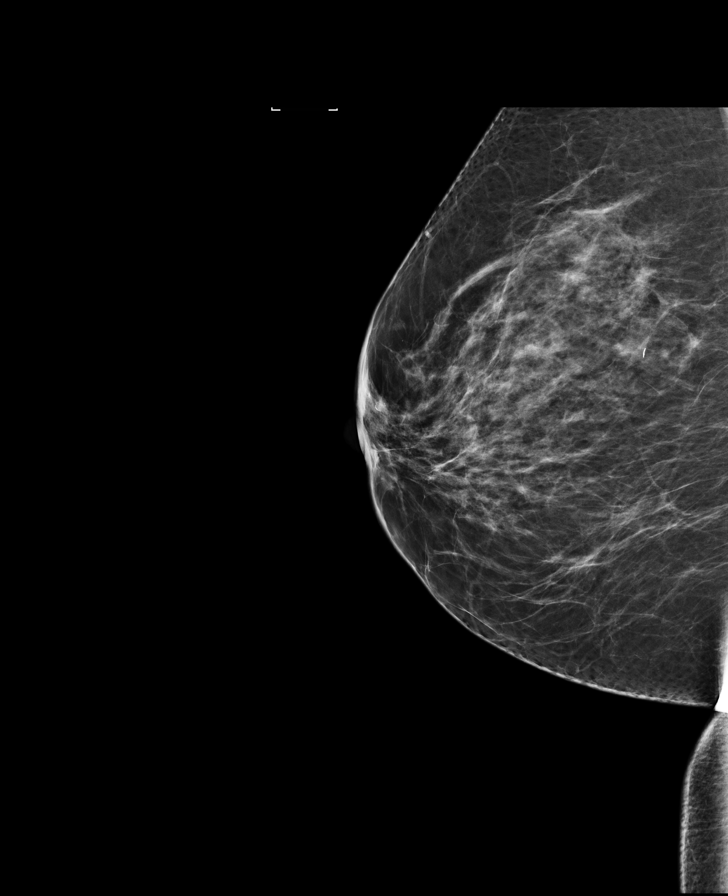

[R CC]
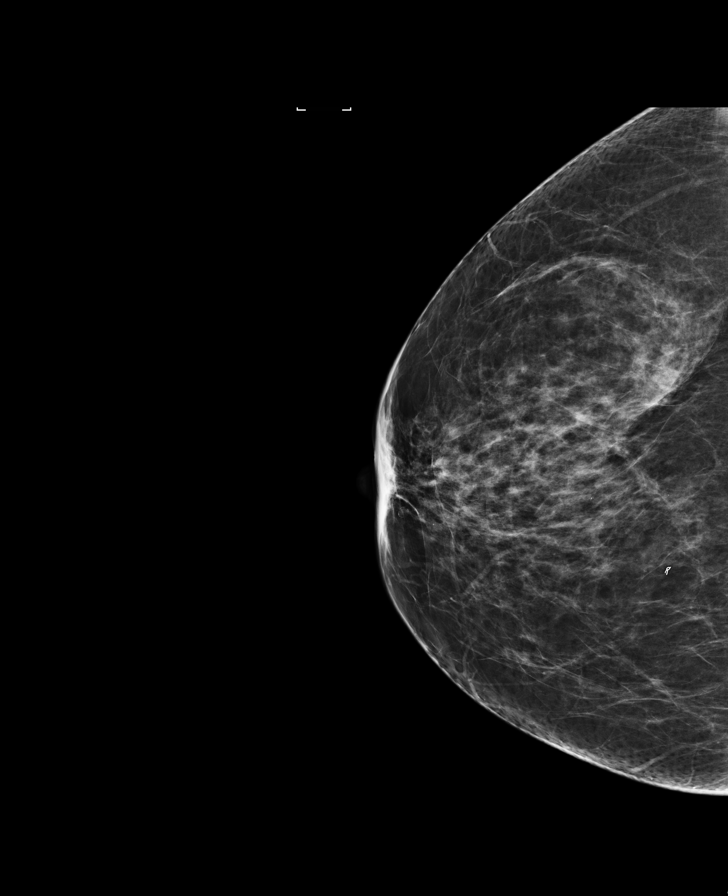

[L MLO synth-2D]
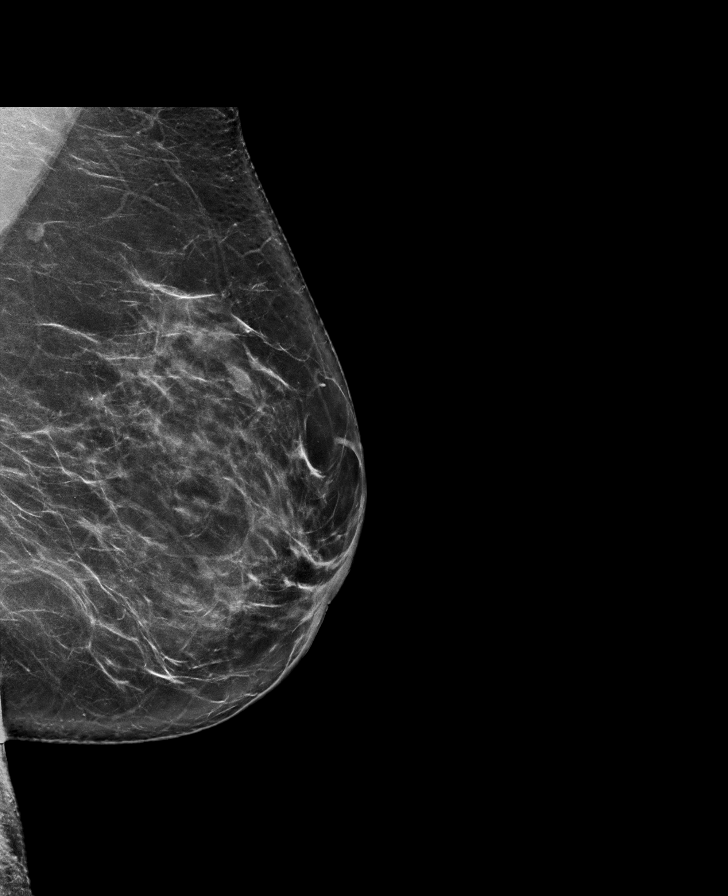

[L CC synth-2D]
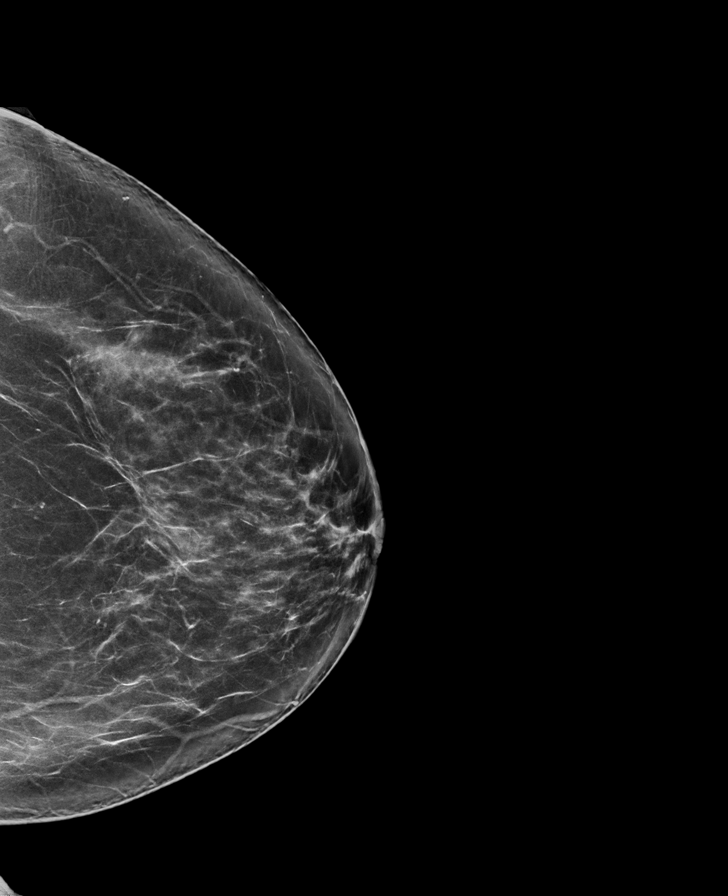

[R MLO (2 of 2)]
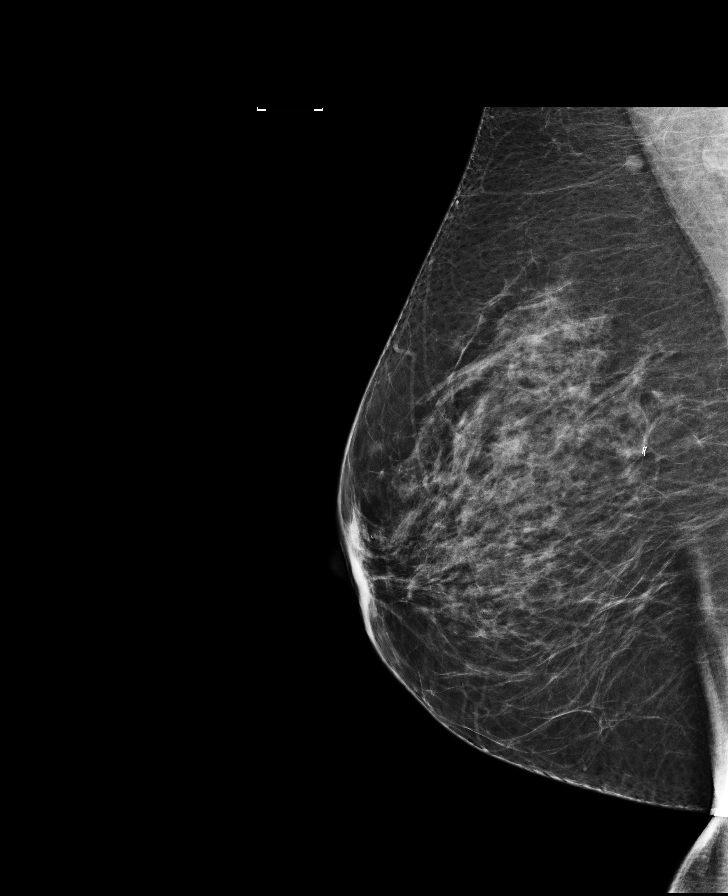

[L MLO]
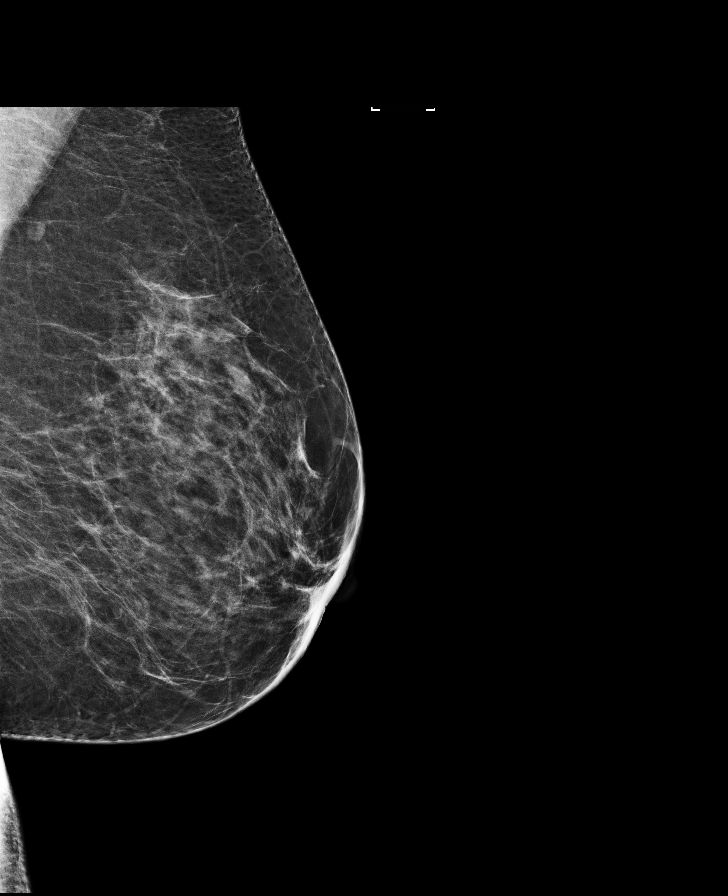

[R MLO synth-2D]
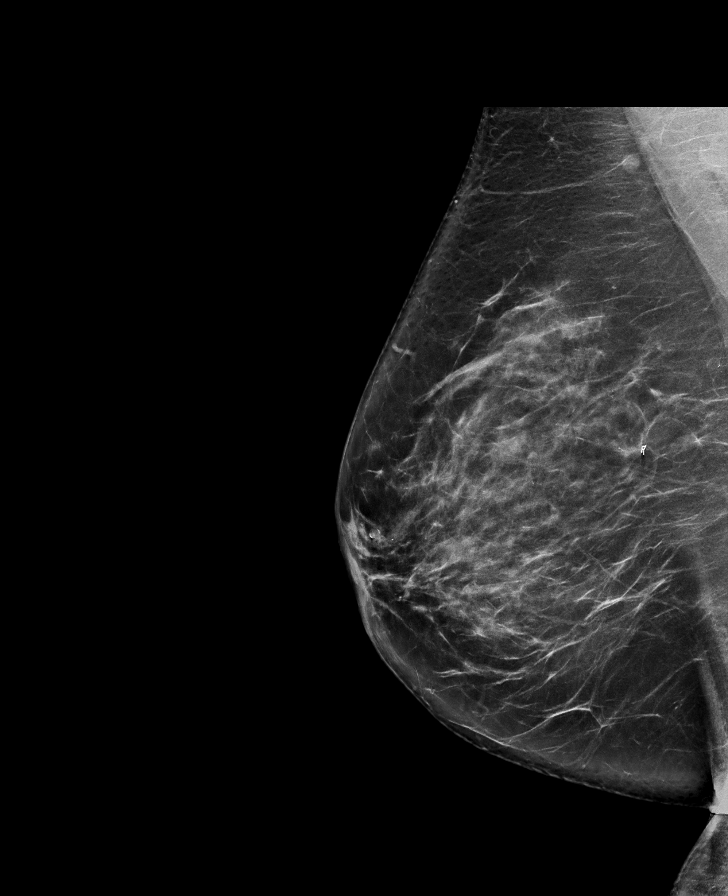

[R CC synth-2D]
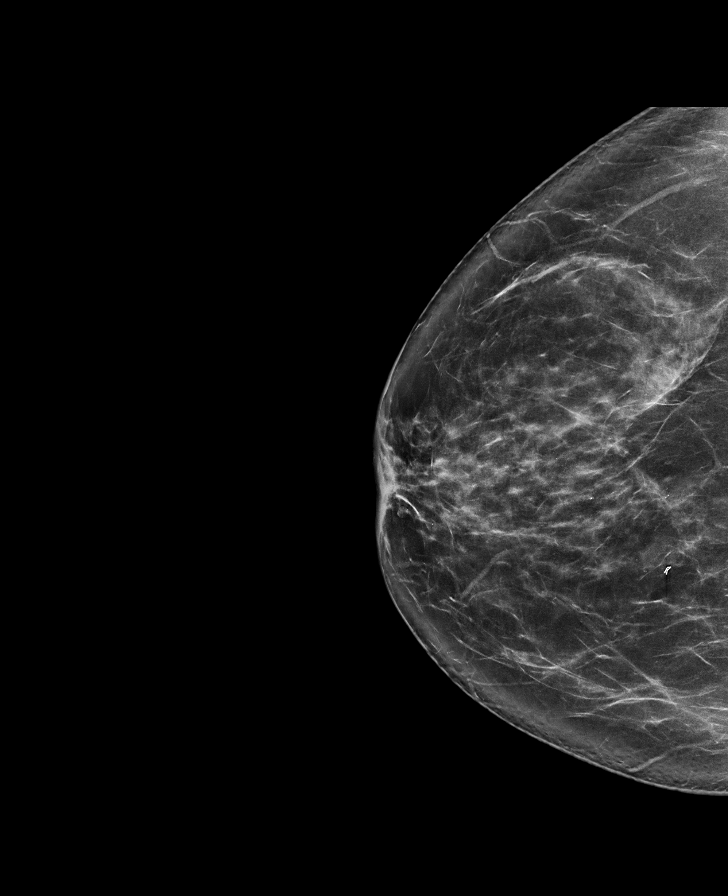

[8 of 29 positions shown; findings below may reference images not displayed]

ACR Breast Density Category b: There are scattered areas of
fibroglandular density.
FINDINGS: There are no findings suspicious for malignancy. Images were
processed with CAD.
IMPRESSION: No mammographic evidence of malignancy. A result letter of this
screening mammogram will be mailed directly to the patient.

RECOMMENDATION:
Screening mammogram in one year. (Code:97-6-RS4)

BI-RADS CATEGORY  1: Negative.

## 2019-01-16 ENCOUNTER — Other Ambulatory Visit: Payer: Self-pay

## 2019-01-16 ENCOUNTER — Ambulatory Visit (INDEPENDENT_AMBULATORY_CARE_PROVIDER_SITE_OTHER): Payer: BC Managed Care – PPO | Admitting: Obstetrics & Gynecology

## 2019-01-16 ENCOUNTER — Encounter: Payer: Self-pay | Admitting: Obstetrics & Gynecology

## 2019-01-16 VITALS — BP 100/60 | Ht 66.0 in | Wt 172.0 lb

## 2019-01-16 DIAGNOSIS — Z01419 Encounter for gynecological examination (general) (routine) without abnormal findings: Secondary | ICD-10-CM

## 2019-01-16 DIAGNOSIS — N3281 Overactive bladder: Secondary | ICD-10-CM

## 2019-01-16 DIAGNOSIS — Z1211 Encounter for screening for malignant neoplasm of colon: Secondary | ICD-10-CM

## 2019-01-16 DIAGNOSIS — Z1239 Encounter for other screening for malignant neoplasm of breast: Secondary | ICD-10-CM

## 2019-01-16 MED ORDER — MIRABEGRON ER 25 MG PO TB24: 25.0000 mg | ORAL_TABLET | Freq: Every day | ORAL | 3 refills | Status: DC

## 2019-01-16 NOTE — Progress Notes (Signed)
HPI:      [name] is a 53 y.o. [phone] who LMP was in the past, she presents today for her annual examination.  The patient has no complaints today.  Replens helps, uses intermittently, did not like concept of HRT.  Myrbetriq also helping, uses daily for OAB sx's. The patient is sexually active. Herlast pap: approximate date 2018 and was normal and last mammogram: approximate date 2019 and was normal.  The patient does perform self breast exams.  There is no notable family history of breast or ovarian cancer in her family. The patient is not taking hormone replacement therapy. Patient denies post-menopausal vaginal bleeding.   The patient has regular exercise: yes. The patient denies current symptoms of depression.    GYN Hx: Last Colonoscopy:5 years ago. Normal.  Last DEXA: never ago.    PMHx: Past Medical History:  Diagnosis Date  . Depression   . Diabetes mellitus without complication ([place])   . Family history of ovarian cancer    Pt is My Risk neg 5/16 except NBN VUS  . GERD (gastroesophageal reflux disease)   . Hypertension   . Iron deficiency    Past Surgical History:  Procedure Laterality Date  . BREAST BIOPSY Right 4-5 yrs ago   core - neg with clip  . BREAST SURGERY Right    biopsy; Dr. Jamal Collin  . CERVICAL DISCECTOMY  03/15/2010  . CESAREAN SECTION  1991  . COLONOSCOPY    . LASIK    . VAGINAL BIRTH AFTER CESAREAN SECTION  1993 and 1997   Family History  Problem Relation Age of Onset  . Lung cancer Mother   . COPD Father   . Hypertension Father   . Allergies Father   . Coronary artery disease Father   . Healthy Sister   . Heart attack Paternal Grandmother   . Ovarian cancer Paternal Grandmother   . Heart attack Paternal Grandfather   . Breast cancer Neg Hx    Social History   Tobacco Use  . Smoking status: Never Smoker  . Smokeless tobacco: Never Used  Substance Use Topics  . Alcohol use: No  . Drug use: No    Current Outpatient Medications:  .   atorvastatin (LIPITOR) 40 MG tablet, Take by mouth., Disp: , Rfl:  .  cetirizine (ZYRTEC ALLERGY) 10 MG tablet, Take by mouth daily. , Disp: , Rfl:  .  desvenlafaxine (PRISTIQ) 50 MG 24 hr tablet, Take by mouth., Disp: , Rfl:  .  dexlansoprazole (DEXILANT) 60 MG capsule, Take 1 capsule (60 mg total) by mouth daily. **PLEASE SCHEDULE YOUR YEARLY VISIT SOON**, Disp: 90 capsule, Rfl: 2 .  FEROSUL 325 (65 Fe) MG tablet, Take 1 tablet (325 mg total) by mouth daily with breakfast., Disp: 30 tablet, Rfl: 3 .  glipiZIDE (GLUCOTROL XL) 5 MG 24 hr tablet, Take 1 tablet (5 mg total) by mouth daily with breakfast., Disp: 30 tablet, Rfl: 0 .  glucose blood (ONETOUCH VERIO) test strip, To check blood sugar once daily, Disp: 100 each, Rfl: 5 .  JANUVIA 100 MG tablet, TAKE 1 TABLET BY MOUTH EVERY DAY, Disp: 90 tablet, Rfl: 1 .  lisinopril-hydrochlorothiazide (PRINZIDE,ZESTORETIC) 10-12.5 MG tablet, TAKE 1 TABLET BY MOUTH EVERY DAY, Disp: 90 tablet, Rfl: 1 .  mirabegron ER (MYRBETRIQ) 25 MG TB24 tablet, Take 1 tablet (25 mg total) by mouth daily., Disp: 90 tablet, Rfl: 3 .  ONETOUCH DELICA LANCETS FINE MISC, To check blood sugar once a day DX:  E11.9, Disp: 100 each, Rfl: 5 .  escitalopram (LEXAPRO) 20 MG tablet, Take 1 tablet (20 mg total) by mouth at bedtime., Disp: 30 tablet, Rfl: 0 .  Insulin Pen Needle 32G X 5 MM MISC, Use one new needle daily for each dose of Saxenda., Disp: 90 each, Rfl: 0 .  metFORMIN (GLUCOPHAGE) 1000 MG tablet, TAKE 1 TABLET BY MOUTH TWICE A DAY WITH MEALS., Disp: 180 tablet, Rfl: 1 Allergies: Patient has no known allergies.  Review of Systems  Constitutional: Negative for chills, fever and malaise/fatigue.  HENT: Negative for congestion, sinus pain and sore throat.   Eyes: Negative for blurred vision and pain.  Respiratory: Negative for cough and wheezing.   Cardiovascular: Negative for chest pain and leg swelling.  Gastrointestinal: Negative for abdominal pain, constipation,  diarrhea, heartburn, nausea and vomiting.  Genitourinary: Negative for dysuria, frequency, hematuria and urgency.  Musculoskeletal: Negative for back pain, joint pain, myalgias and neck pain.  Skin: Negative for itching and rash.  Neurological: Negative for dizziness, tremors and weakness.  Endo/Heme/Allergies: Does not bruise/bleed easily.  Psychiatric/Behavioral: Negative for depression. The patient is not nervous/anxious and does not have insomnia.     Objective: BP 100/60   Ht 5\' 6"  (1.676 m)   Wt 172 lb (78 kg)   LMP 07/18/2010   BMI 27.76 kg/m   Filed Weights   01/16/19 0902  Weight: 172 lb (78 kg)   Body mass index is 27.76 kg/m. Physical Exam Constitutional:      General: She is not in acute distress.    Appearance: She is well-developed.  Genitourinary:     Pelvic exam was performed with patient supine.     Vagina, uterus and rectum normal.     No lesions in the vagina.     No vaginal bleeding.     No cervical motion tenderness, friability, lesion or polyp.     Uterus is mobile.     Uterus is not enlarged.     No uterine mass detected.    Uterus is midaxial.     No right or left adnexal mass present.     Right adnexa not tender.     Left adnexa not tender.  HENT:     Head: Normocephalic and atraumatic. No laceration.     Right Ear: Hearing normal.     Left Ear: Hearing normal.     Mouth/Throat:     Pharynx: Uvula midline.  Eyes:     Pupils: Pupils are equal, round, and reactive to light.  Neck:     Musculoskeletal: Normal range of motion and neck supple.     Thyroid: No thyromegaly.  Cardiovascular:     Rate and Rhythm: Normal rate and regular rhythm.     Heart sounds: No murmur. No friction rub. No gallop.   Pulmonary:     Effort: Pulmonary effort is normal. No respiratory distress.     Breath sounds: Normal breath sounds. No wheezing.  Chest:     Breasts:        Right: No mass, skin change or tenderness.        Left: No mass, skin change or  tenderness.  Abdominal:     General: Bowel sounds are normal. There is no distension.     Palpations: Abdomen is soft.     Tenderness: There is no abdominal tenderness. There is no rebound.  Musculoskeletal: Normal range of motion.  Neurological:     Mental Status: She is alert and oriented  to person, place, and time.     Cranial Nerves: No cranial nerve deficit.  Skin:    General: Skin is warm and dry.  Psychiatric:        Judgment: Judgment normal.  Vitals signs reviewed.     Assessment: Annual Exam 1. Women's annual routine gynecological examination   2. Screening for breast cancer   3. Screen for colon cancer   4. Overactive bladder     Plan:            1.  Cervical Screening-  Pap smear schedule reviewed with patient  2. Breast screening- Exam annually and mammogram scheduled  3. Colonoscopy every 10 years, Hemoccult testing after age 54  4. Labs managed by PCP  5. Counseling for hormonal therapy: none Replens for vag dryness, pt using prn              6. FRAX - FRAX score for assessing the 10 year probability for fracture calculated and discussed today.  Based on age and score today, DEXA is not scheduled.   7.  Overactive bladder - cont meds - mirabegron ER (MYRBETRIQ) 25 MG TB24 tablet; Take 1 tablet (25 mg total) by mouth daily.  Dispense: 90 tablet; Refill: 3     F/U  Return in about 1 year (around 01/16/2020) for Annual.  Annamarie MajorPaul Haileigh Pitz, MD, [facility], [facility] 01/16/2019  9:23 AM

## 2019-01-16 NOTE — Patient Instructions (Signed)
PAP every three years Mammogram every year    Call [phone] to schedule at [facility] Colonoscopy every 10 years Labs yearly (with PCP)   

## 2019-01-28 ENCOUNTER — Ambulatory Visit
Admission: RE | Admit: 2019-01-28 | Discharge: 2019-01-28 | Disposition: A | Discharge status: Home / Self Care | Payer: BC Managed Care – PPO | Source: Ambulatory Visit | Attending: Obstetrics & Gynecology | Admitting: Obstetrics & Gynecology

## 2019-01-28 ENCOUNTER — Other Ambulatory Visit: Payer: Self-pay

## 2019-01-28 DIAGNOSIS — Z1239 Encounter for other screening for malignant neoplasm of breast: Secondary | ICD-10-CM | POA: Diagnosis present

## 2019-01-28 DIAGNOSIS — Z1231 Encounter for screening mammogram for malignant neoplasm of breast: Secondary | ICD-10-CM | POA: Diagnosis not present

## 2019-01-29 ENCOUNTER — Other Ambulatory Visit: Payer: Self-pay | Admitting: Obstetrics & Gynecology

## 2019-01-29 DIAGNOSIS — R921 Mammographic calcification found on diagnostic imaging of breast: Secondary | ICD-10-CM

## 2019-01-29 DIAGNOSIS — R928 Other abnormal and inconclusive findings on diagnostic imaging of breast: Secondary | ICD-10-CM

## 2019-01-30 ENCOUNTER — Encounter: Payer: Self-pay | Admitting: Obstetrics & Gynecology

## 2019-02-02 NOTE — Telephone Encounter (Signed)
Left message letting patient know that Samantha at [facility] has the orders for her additional views. Aldona Bar stated that she is currently working on orders that have came in before hers as they work them in order as the are received.

## 2019-02-02 NOTE — Telephone Encounter (Signed)
Yes, orders have been placed and I have spoke to [facility] about this.  They should be calling, but perhaps we could call [facility] to check on the progress

## 2019-02-02 NOTE — Telephone Encounter (Signed)
Do you know anything about her extra images ?

## 2019-02-05 ENCOUNTER — Ambulatory Visit
Admission: RE | Admit: 2019-02-05 | Discharge: 2019-02-05 | Disposition: A | Discharge status: Home / Self Care | Payer: BC Managed Care – PPO | Source: Ambulatory Visit | Attending: Obstetrics & Gynecology | Admitting: Obstetrics & Gynecology

## 2019-02-05 DIAGNOSIS — R921 Mammographic calcification found on diagnostic imaging of breast: Secondary | ICD-10-CM | POA: Diagnosis present

## 2019-02-05 DIAGNOSIS — R928 Other abnormal and inconclusive findings on diagnostic imaging of breast: Secondary | ICD-10-CM | POA: Insufficient documentation

## 2019-02-12 ENCOUNTER — Ambulatory Visit: Payer: BC Managed Care – PPO

## 2019-04-06 ENCOUNTER — Encounter: Payer: Self-pay | Admitting: Obstetrics & Gynecology

## 2019-06-29 ENCOUNTER — Telehealth: Payer: Self-pay

## 2019-06-29 ENCOUNTER — Telehealth: Payer: Self-pay | Admitting: Obstetrics & Gynecology

## 2019-06-29 NOTE — Telephone Encounter (Signed)
Patient is calling needing Dr. Tiburcio Pea to put in order for her 6 months mammogram. Please advise

## 2019-06-29 NOTE — Telephone Encounter (Signed)
Patient is calling about the medication Dexilant. She states that her insurance company is not going to cover her medication this year unless she tries a generic medication. Patient states she has tried all the generic medications in the past and they do not help her symptoms.

## 2019-06-30 ENCOUNTER — Other Ambulatory Visit: Payer: Self-pay | Admitting: Obstetrics & Gynecology

## 2019-06-30 ENCOUNTER — Telehealth: Payer: Self-pay | Admitting: Obstetrics & Gynecology

## 2019-06-30 DIAGNOSIS — R928 Other abnormal and inconclusive findings on diagnostic imaging of breast: Secondary | ICD-10-CM

## 2019-06-30 NOTE — Telephone Encounter (Signed)
Order placed for [facility], for Feb 2021

## 2019-06-30 NOTE — Telephone Encounter (Signed)
Patient aware of her appt time and date at [facility] on 08/10/2019 @ 11:20 am.

## 2019-07-01 ENCOUNTER — Other Ambulatory Visit: Payer: Self-pay

## 2019-07-01 DIAGNOSIS — K2100 Gastro-esophageal reflux disease with esophagitis, without bleeding: Secondary | ICD-10-CM

## 2019-07-01 MED ORDER — DEXILANT 60 MG PO CPDR: 60.0000 mg | DELAYED_RELEASE_CAPSULE | Freq: Every day | ORAL | 0 refills | Status: DC

## 2019-07-01 NOTE — Telephone Encounter (Signed)
Spoke with pt regarding her Dexilant medication. Advised her I can work on a prior authorization to see if we can get it approved.

## 2019-07-28 ENCOUNTER — Ambulatory Visit: Payer: BC Managed Care – PPO | Attending: Internal Medicine

## 2019-07-28 DIAGNOSIS — Z20822 Contact with and (suspected) exposure to covid-19: Secondary | ICD-10-CM

## 2019-07-29 LAB — NOVEL CORONAVIRUS, NAA: SARS-CoV-2, NAA: DETECTED — AB

## 2019-07-30 ENCOUNTER — Observation Stay
Admission: EM | Admit: 2019-07-30 | Discharge: 2019-07-31 | Disposition: A | Discharge status: Other Acute Inpatient Hospital | Payer: BC Managed Care – PPO | Attending: Internal Medicine | Admitting: Internal Medicine

## 2019-07-30 ENCOUNTER — Ambulatory Visit (HOSPITAL_COMMUNITY): Payer: BC Managed Care – PPO

## 2019-07-30 ENCOUNTER — Emergency Department: Payer: BC Managed Care – PPO

## 2019-07-30 ENCOUNTER — Other Ambulatory Visit: Payer: Self-pay

## 2019-07-30 ENCOUNTER — Encounter: Payer: Self-pay | Admitting: Emergency Medicine

## 2019-07-30 ENCOUNTER — Other Ambulatory Visit: Payer: Self-pay | Admitting: Nurse Practitioner

## 2019-07-30 DIAGNOSIS — E785 Hyperlipidemia, unspecified: Secondary | ICD-10-CM | POA: Diagnosis not present

## 2019-07-30 DIAGNOSIS — K219 Gastro-esophageal reflux disease without esophagitis: Secondary | ICD-10-CM | POA: Insufficient documentation

## 2019-07-30 DIAGNOSIS — Z79899 Other long term (current) drug therapy: Secondary | ICD-10-CM | POA: Insufficient documentation

## 2019-07-30 DIAGNOSIS — E860 Dehydration: Secondary | ICD-10-CM | POA: Diagnosis not present

## 2019-07-30 DIAGNOSIS — E876 Hypokalemia: Secondary | ICD-10-CM | POA: Diagnosis not present

## 2019-07-30 DIAGNOSIS — F329 Major depressive disorder, single episode, unspecified: Secondary | ICD-10-CM | POA: Insufficient documentation

## 2019-07-30 DIAGNOSIS — U071 COVID-19: Principal | ICD-10-CM | POA: Diagnosis present

## 2019-07-30 DIAGNOSIS — E119 Type 2 diabetes mellitus without complications: Secondary | ICD-10-CM

## 2019-07-30 DIAGNOSIS — Z794 Long term (current) use of insulin: Secondary | ICD-10-CM | POA: Insufficient documentation

## 2019-07-30 DIAGNOSIS — I10 Essential (primary) hypertension: Secondary | ICD-10-CM | POA: Diagnosis present

## 2019-07-30 DIAGNOSIS — R7989 Other specified abnormal findings of blood chemistry: Secondary | ICD-10-CM | POA: Diagnosis present

## 2019-07-30 LAB — COMPREHENSIVE METABOLIC PANEL
ALT: 85 U/L — ABNORMAL HIGH (ref 0–44)
AST: 64 U/L — ABNORMAL HIGH (ref 15–41)
Albumin: 4.7 g/dL (ref 3.5–5.0)
Alkaline Phosphatase: 100 U/L (ref 38–126)
Anion gap: 21 — ABNORMAL HIGH (ref 5–15)
BUN: 14 mg/dL (ref 6–20)
CO2: 18 mmol/L — ABNORMAL LOW (ref 22–32)
Calcium: 9.2 mg/dL (ref 8.9–10.3)
Chloride: 94 mmol/L — ABNORMAL LOW (ref 98–111)
Creatinine, Ser: 0.61 mg/dL (ref 0.44–1.00)
GFR calc Af Amer: >60 mL/min (ref >60)
GFR calc non Af Amer: >60 mL/min (ref >60)
Glucose, Bld: 151 mg/dL — ABNORMAL HIGH (ref 70–99)
Potassium: 2.7 mmol/L — CL (ref 3.5–5.1)
Sodium: 133 mmol/L — ABNORMAL LOW (ref 135–145)
Total Bilirubin: 1 mg/dL (ref 0.3–1.2)
Total Protein: 8.2 g/dL — ABNORMAL HIGH (ref 6.5–8.1)

## 2019-07-30 LAB — CBC WITH DIFFERENTIAL/PLATELET
Abs Immature Granulocytes: 0.02 10*3/uL (ref 0.00–0.07)
Abs Immature Granulocytes: 0.01 10*3/uL (ref 0.00–0.07)
Basophils Absolute: 0 10*3/uL (ref 0.0–0.1)
Basophils Absolute: 0 10*3/uL (ref 0.0–0.1)
Basophils Relative: 0 %
Basophils Relative: 0 %
Eosinophils Absolute: 0.1 10*3/uL (ref 0.0–0.5)
Eosinophils Absolute: 0 10*3/uL (ref 0.0–0.5)
Eosinophils Relative: 1 %
Eosinophils Relative: 0 %
HCT: 46.2 % — ABNORMAL HIGH (ref 36.0–46.0)
HCT: 42.1 % (ref 36.0–46.0)
Hemoglobin: 16.6 g/dL — ABNORMAL HIGH (ref 12.0–15.0)
Hemoglobin: 15.1 g/dL — ABNORMAL HIGH (ref 12.0–15.0)
Immature Granulocytes: 0 %
Immature Granulocytes: 0 %
Lymphocytes Relative: 43 %
Lymphocytes Relative: 37 %
Lymphs Abs: 3.7 10*3/uL (ref 0.7–4.0)
Lymphs Abs: 2.2 10*3/uL (ref 0.7–4.0)
MCH: 31.8 pg (ref 26.0–34.0)
MCH: 31.9 pg (ref 26.0–34.0)
MCHC: 35.9 g/dL (ref 30.0–36.0)
MCHC: 35.9 g/dL (ref 30.0–36.0)
MCV: 88.5 fL (ref 80.0–100.0)
MCV: 88.8 fL (ref 80.0–100.0)
Monocytes Absolute: 0.8 10*3/uL (ref 0.1–1.0)
Monocytes Absolute: 0.5 10*3/uL (ref 0.1–1.0)
Monocytes Relative: 10 %
Monocytes Relative: 9 %
Neutro Abs: 3.9 10*3/uL (ref 1.7–7.7)
Neutro Abs: 3.2 10*3/uL (ref 1.7–7.7)
Neutrophils Relative %: 46 %
Neutrophils Relative %: 54 %
Platelets: 167 10*3/uL (ref 150–400)
Platelets: 128 10*3/uL — ABNORMAL LOW (ref 150–400)
RBC: 5.22 MIL/uL — ABNORMAL HIGH (ref 3.87–5.11)
RBC: 4.74 MIL/uL (ref 3.87–5.11)
RDW: 11.5 % (ref 11.5–15.5)
RDW: 11.5 % (ref 11.5–15.5)
WBC: 8.6 10*3/uL (ref 4.0–10.5)
WBC: 5.9 10*3/uL (ref 4.0–10.5)
nRBC: 0 % (ref 0.0–0.2)
nRBC: 0 % (ref 0.0–0.2)

## 2019-07-30 LAB — TROPONIN I (HIGH SENSITIVITY): Troponin I (High Sensitivity): 3 ng/L

## 2019-07-30 LAB — BASIC METABOLIC PANEL
Anion gap: 13 (ref 5–15)
BUN: 14 mg/dL (ref 6–20)
CO2: 22 mmol/L (ref 22–32)
Calcium: 8.7 mg/dL — ABNORMAL LOW (ref 8.9–10.3)
Chloride: 96 mmol/L — ABNORMAL LOW (ref 98–111)
Creatinine, Ser: 0.53 mg/dL (ref 0.44–1.00)
GFR calc Af Amer: >60 mL/min (ref >60)
GFR calc non Af Amer: >60 mL/min (ref >60)
Glucose, Bld: 121 mg/dL — ABNORMAL HIGH (ref 70–99)
Potassium: 3.2 mmol/L — ABNORMAL LOW (ref 3.5–5.1)
Sodium: 131 mmol/L — ABNORMAL LOW (ref 135–145)

## 2019-07-30 LAB — LACTIC ACID, PLASMA
Lactic Acid, Venous: 4 mmol/L (ref 0.5–1.9)
Lactic Acid, Venous: 2.8 mmol/L (ref 0.5–1.9)

## 2019-07-30 LAB — FIBRINOGEN: Fibrinogen: 494 mg/dL — ABNORMAL HIGH (ref 210–475)

## 2019-07-30 LAB — LACTATE DEHYDROGENASE: LDH: 167 U/L (ref 98–192)

## 2019-07-30 LAB — FERRITIN: Ferritin: 572 ng/mL — ABNORMAL HIGH (ref 11–307)

## 2019-07-30 LAB — TRIGLYCERIDES: Triglycerides: 146 mg/dL (ref <150)

## 2019-07-30 LAB — FIBRIN DERIVATIVES D-DIMER (ARMC ONLY): Fibrin derivatives D-dimer (ARMC): 412.82 ng/mL (FEU) (ref 0.00–499.00)

## 2019-07-30 LAB — MAGNESIUM: Magnesium: 2.2 mg/dL (ref 1.7–2.4)

## 2019-07-30 LAB — PROCALCITONIN: Procalcitonin: <0.1 ng/mL

## 2019-07-30 MED ORDER — INSULIN ASPART 100 UNIT/ML ~~LOC~~ SOLN: 0.0000 [IU] | Freq: Three times a day (TID) | SUBCUTANEOUS | Status: DC

## 2019-07-30 MED ORDER — SODIUM CHLORIDE 0.9 % IV SOLN
100.0000 mg | Freq: Every day | INTRAVENOUS | Status: DC
  Filled 2019-07-30: qty 20

## 2019-07-30 MED ORDER — INSULIN DETEMIR 100 UNIT/ML ~~LOC~~ SOLN
0.1000 [IU]/kg | Freq: Two times a day (BID) | SUBCUTANEOUS | Status: DC
  Filled 2019-07-30 (×2): qty 0.07

## 2019-07-30 MED ORDER — POTASSIUM CHLORIDE 10 MEQ/100ML IV SOLN
10.0000 meq | INTRAVENOUS | Status: AC
  Administered 2019-07-30: 22:00:00 10 meq via INTRAVENOUS
  Filled 2019-07-30 (×2): qty 100

## 2019-07-30 MED ORDER — SODIUM CHLORIDE 0.9 % IV SOLN
200.0000 mg | Freq: Once | INTRAVENOUS | Status: AC
  Administered 2019-07-30: 200 mg via INTRAVENOUS
  Filled 2019-07-30: qty 200

## 2019-07-30 MED ORDER — LINAGLIPTIN 5 MG PO TABS
5.0000 mg | ORAL_TABLET | Freq: Every day | ORAL | Status: DC
  Filled 2019-07-30 (×2): qty 1

## 2019-07-30 MED ORDER — MAGNESIUM SULFATE 2 GM/50ML IV SOLN
2.0000 g | Freq: Once | INTRAVENOUS | Status: AC
  Administered 2019-07-30: 19:00:00 2 g via INTRAVENOUS
  Filled 2019-07-30: qty 50

## 2019-07-30 MED ORDER — POTASSIUM CHLORIDE 10 MEQ/100ML IV SOLN
10.0000 meq | Freq: Once | INTRAVENOUS | Status: AC
  Administered 2019-07-30: 21:00:00 10 meq via INTRAVENOUS
  Filled 2019-07-30: qty 100

## 2019-07-30 MED ORDER — LACTATED RINGERS IV BOLUS
1000.0000 mL | Freq: Once | INTRAVENOUS | Status: AC
  Administered 2019-07-30: 19:00:00 1000 mL via INTRAVENOUS

## 2019-07-30 MED ORDER — SODIUM CHLORIDE 0.9 % IV SOLN
1000.0000 mL | INTRAVENOUS | Status: DC
  Administered 2019-07-30: 1000 mL via INTRAVENOUS

## 2019-07-30 MED ORDER — POTASSIUM CHLORIDE CRYS ER 20 MEQ PO TBCR
40.0000 meq | EXTENDED_RELEASE_TABLET | Freq: Once | ORAL | Status: AC
  Administered 2019-07-30: 40 meq via ORAL
  Filled 2019-07-30: qty 2

## 2019-07-30 NOTE — ED Triage Notes (Signed)
Pt presents to ED via POV with c/o SOB. 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 Pt states going to [facility] for 1630 appt for infusion for Covid. Pt states while in car she began having cramping and SOB.

## 2019-07-30 NOTE — Consult Note (Signed)
UBAH RADKE NOI:[id number] DOB: Feb 24, 1966 DOA: 07/30/2019     PCP: Nadara Mustard, MD   Outpatient Specialists:  NONE    Patient arrived to ER on 07/30/19 at 1555  Patient coming from: home     Chief Complaint:  Chief Complaint  Patient presents with  . Shortness of Breath    HPI: Misty Graham is a 54 y.o. female with medical history significant of recent diagnosis of Covid , DM 2, GERD, HTN  Presented with shortness of breath  she was on her way to [facility] to get infusion of monoclonal antibody when she developed cramping and shortness of breath Reports symptoms for the past week lightheadedness when she stands up.  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 She has been ill for the past 4 days and has tested positive on 07/28/19 Reports hx of fever no diarrhea but decreased PO  Otherwise fatigue and malaise Infectious risk factors:  Reports  fever, shortness of breath,    KNOWN COVID POSITIVE      Lab Results  Component Value Date   SARSCOV2NAA Detected (A) 07/28/2019     Regarding pertinent Chronic problems:    Hyperlipidemia -  on statins Lipitor   HTN on Lisinopril/Hctz    DM 2 -  Lab Results  Component Value Date   HGBA1C 7.2 11/01/2016   on insulin,     While in ER: CXR showing RUL PNA K 2.7    The following Work up has been ordered so far:  Orders Placed This Encounter  Procedures  . Blood Culture (routine x 2)  . DG Chest 2 View  . CBC with Differential  . Comprehensive metabolic panel  . Lactic acid, plasma  . Basic metabolic panel  . CBC WITH DIFFERENTIAL  . Procalcitonin  . Lactate dehydrogenase  . Ferritin  . Triglycerides  . Fibrinogen  . C-reactive protein  . Diet NPO time specified  . Cardiac monitoring  . Insert peripheral IV x 2  . Initiate Carrier Fluid Protocol  . Place surgical mask on patient  . Patient to wear surgical mask during transportation  . Assess patient for ability to self-prone. If able  (can move self in bed, ambulate) and stable (SpO2 and oxygen requirement):  . RN/NT - Document specific oxygen requirements in CHL  . Notify EDP if new oxygen requirements escalates > 4L per minute [place]  . RN to draw the following extra tubes:  . Consult to hospitalist  ALL PATIENTS BEING ADMITTED/HAVING PROCEDURES NEED COVID-19 SCREENING  . Airborne and Contact precautions  . Pulse oximetry, continuous  . EKG 12-Lead     Following Medications were ordered in ER: Medications  magnesium sulfate IVPB 2 g 50 mL (2 g Intravenous New Bag/Given 07/30/19 1907)  potassium chloride 10 mEq in 100 mL IVPB (has no administration in time range)  0.9 %  sodium chloride infusion (has no administration in time range)  lactated ringers bolus 1,000 mL (1,000 mLs Intravenous New Bag/Given 07/30/19 1906)  potassium chloride SA (KLOR-CON) CR tablet 40 mEq (40 mEq Oral Given 07/30/19 1908)        Consult Orders  (From admission, onward)         Start     Ordered   07/30/19 1948  Consult to hospitalist  ALL PATIENTS BEING ADMITTED/HAVING PROCEDURES NEED COVID-19 SCREENING  Once    Comments: ALL PATIENTS BEING ADMITTED/HAVING PROCEDURES NEED COVID-19 SCREENING  Provider:  (  Not yet assigned)  Question Answer Comment  Place call to: [phone]   Reason for Consult Admit      07/30/19 1947           Significant initial  Findings: Abnormal Labs Reviewed  CBC WITH DIFFERENTIAL/PLATELET - Abnormal; Notable for the following components:      Result Value   RBC 5.22 (*)    Hemoglobin 16.6 (*)    HCT 46.2 (*)    All other components within normal limits  COMPREHENSIVE METABOLIC PANEL - Abnormal; Notable for the following components:   Sodium 133 (*)    Potassium 2.7 (*)    Chloride 94 (*)    CO2 18 (*)    Glucose, Bld 151 (*)    Total Protein 8.2 (*)    AST 64 (*)    ALT 85 (*)    Anion gap 21 (*)    All other components within normal limits  LACTIC ACID, PLASMA - Abnormal; Notable for the following  components:   Lactic Acid, Venous 4.0 (*)    All other components within normal limits     Otherwise labs showing:    Recent Labs  Lab 07/30/19 1606  NA 133*  K 2.7*  CO2 18*  GLUCOSE 151*  BUN 14  CREATININE 0.61  CALCIUM 9.2    Cr    stable,   Lab Results  Component Value Date   CREATININE 0.61 07/30/2019   CREATININE 0.66 02/02/2016   CREATININE 0.70 09/13/2015    Recent Labs  Lab 07/30/19 1606  AST 64*  ALT 85*  ALKPHOS 100  BILITOT 1.0  PROT 8.2*  ALBUMIN 4.7   Lab Results  Component Value Date   CALCIUM 9.2 07/30/2019     WBC      Component Value Date/Time   WBC 8.6 07/30/2019 1606   ANC    Component Value Date/Time   NEUTROABS 3.9 07/30/2019 1606   NEUTROABS 2.4 02/02/2016 1454   ALC No components found for: LYMPHAB    Plt: Lab Results  Component Value Date   PLT 167 07/30/2019    Lactic Acid, Venous    Component Value Date/Time   LATICACIDVEN 4.0 (HH) 07/30/2019 1857    Procalcitonin   Ordered   COVID-19 Labs  No results for input(s): DDIMER, FERRITIN, LDH, CRP in the last 72 hours.  Lab Results  Component Value Date   SARSCOV2NAA Detected (A) 07/28/2019    HG/HCT Up from baseline see below    Component Value Date/Time   HGB 16.6 (H) 07/30/2019 1606   HGB 15.5 02/02/2016 1454   HCT 46.2 (H) 07/30/2019 1606   HCT 46.3 02/02/2016 1454     Troponin ordered Cardiac Panel (last 3 results) No results for input(s): CKTOTAL, CKMB, TROPONINI, RELINDX in the last 72 hours.     ECG: Ordered Personally reviewed by me showing: HR : 109 Rhythm:  Sinus tachycardia   no evidence of ischemic changes QTC 467   CXR - new infiltrate     ED Triage Vitals  Enc Vitals Group     BP 07/30/19 1600 129/71     Pulse Rate 07/30/19 1557 (!) 120     Resp 07/30/19 1600 20     Temp 07/30/19 1557 (!) 97.5 F (36.4 C)     Temp Source 07/30/19 1557 Oral     SpO2 07/30/19 1557 96 %     Weight 07/30/19 1601 165 lb (74.8 kg)     Height  07/30/19 1601 5\' 6"  (1.676 m)     Head Circumference --      Peak Flow --      Pain Score 07/30/19 1601 4     Pain Loc --      Pain Edu? --      Excl. in GC? --   TMAX(24)@       Latest  Blood pressure 129/71, pulse (!) 112, temperature (!) 97.5 F (36.4 C), temperature source Oral, resp. rate 20, height 5\' 6"  (1.676 m), weight 74.8 kg, last menstrual period 07/18/2010, SpO2 99 %.    Hospitalist was called for admission for Covid infection    Review of Systems:    Pertinent positives include:  Fevers, chills, fatigue  Constitutional:  No weight loss, night sweats,, weight loss  HEENT:  No headaches, Difficulty swallowing,Tooth/dental problems,Sore throat,  No sneezing, itching, ear ache, nasal congestion, post nasal drip,  Cardio-vascular:  No chest pain, Orthopnea, PND, anasarca, dizziness, palpitations.no Bilateral lower extremity swelling  GI:  No heartburn, indigestion, abdominal pain, nausea, vomiting, diarrhea, change in bowel habits, loss of appetite, melena, blood in stool, hematemesis Resp:  no shortness of breath at rest. No dyspnea on exertion, No excess mucus, no productive cough, No non-productive cough, No coughing up of blood.No change in color of mucus.No wheezing. Skin:  no rash or lesions. No jaundice GU:  no dysuria, change in color of urine, no urgency or frequency. No straining to urinate.  No flank pain.  Musculoskeletal:  No joint pain or no joint swelling. No decreased range of motion. No back pain.  Psych:  No change in mood or affect. No depression or anxiety. No memory loss.  Neuro: no localizing neurological complaints, no tingling, no weakness, no double vision, no gait abnormality, no slurred speech, no confusion  All systems reviewed and apart from HOPI all are negative  Past Medical History:   Past Medical History:  Diagnosis Date  . Depression   . Diabetes mellitus without complication (HCC)   . Family history of ovarian cancer    Pt  is My Risk neg 5/16 except NBN VUS  . GERD (gastroesophageal reflux disease)   . Hypertension   . Iron deficiency      Past Surgical History:  Procedure Laterality Date  . BREAST BIOPSY Right 4-5 yrs ago   core - neg with clip  . BREAST SURGERY Right    biopsy; Dr. 07/20/2010  . CERVICAL DISCECTOMY  03/15/2010  . CESAREAN SECTION  1991  . COLONOSCOPY    . LASIK    . VAGINAL BIRTH AFTER CESAREAN SECTION  1993 and 1997    Social History:  Ambulatory  Independently     reports that she has never smoked. She has never used smokeless tobacco. She reports that she does not drink alcohol or use drugs.   Family History:   Family History  Problem Relation Age of Onset  . Lung cancer Mother   . COPD Father   . Hypertension Father   . Allergies Father   . Coronary artery disease Father   . Healthy Sister   . Heart attack Paternal Grandmother   . Ovarian cancer Paternal Grandmother   . Heart attack Paternal Grandfather   . Breast cancer Neg Hx     Allergies: No Known Allergies   Prior to Admission medications   Medication Sig Start Date End Date Taking? Authorizing Provider  atorvastatin (LIPITOR) 40 MG tablet Take by mouth. 03/12/18 03/12/19  [provider]  cetirizine (ZYRTEC ALLERGY) 10 MG tablet Take by mouth daily.     [provider]  desvenlafaxine (PRISTIQ) 50 MG 24 hr tablet Take by mouth. 11/20/18 11/20/19  [provider]  dexlansoprazole (DEXILANT) 60 MG capsule Take 1 capsule (60 mg total) by mouth daily. **PLEASE SCHEDULE YOUR YEARLY VISIT SOON** 07/01/19   Midge Minium, MD  escitalopram (LEXAPRO) 20 MG tablet Take 1 tablet (20 mg total) by mouth at bedtime. 06/26/18   Maple Hudson., MD  FEROSUL 325 [phone] Fe) MG tablet Take 1 tablet (325 mg total) by mouth daily with breakfast. 04/17/18   Maple Hudson., MD  glipiZIDE (GLUCOTROL XL) 5 MG 24 hr tablet Take 1 tablet (5 mg total) by mouth daily with breakfast. 04/17/18   Maple Hudson., MD  glucose blood [facility] VERIO) test strip To check blood sugar once daily 10/17/16   Joycelyn Man M, PA-C  Insulin Pen Needle 32G X 5 MM MISC Use one new needle daily for each dose of Saxenda. 11/15/16   Trey Sailors, PA-C  JANUVIA 100 MG tablet TAKE 1 TABLET BY MOUTH EVERY DAY 02/26/18   Margaretann Loveless, PA-C  lisinopril-hydrochlorothiazide (PRINZIDE,ZESTORETIC) 10-12.5 MG tablet TAKE 1 TABLET BY MOUTH EVERY DAY 12/11/17   Margaretann Loveless, PA-C  metFORMIN (GLUCOPHAGE) 1000 MG tablet TAKE 1 TABLET BY MOUTH TWICE A DAY WITH MEALS. 12/11/17   Margaretann Loveless, PA-C  mirabegron ER (MYRBETRIQ) 25 MG TB24 tablet Take 1 tablet (25 mg total) by mouth daily. 01/16/19   Nadara Mustard, MD  [facility] DELICA LANCETS FINE MISC To check blood sugar once a day DX:  E11.9 07/29/15   Lorie Phenix, MD   Physical Exam: Blood pressure 129/71, pulse (!) 112, temperature (!) 97.5 F (36.4 C), temperature source Oral, resp. rate 20, height 5\' 6"  (1.676 m), weight 74.8 kg, last menstrual period 07/18/2010, SpO2 99 %. 1. General:  in No  Acute distress  well-appearing 2. Psychological: Alert and  Oriented 3. Head/ENT:     Dry Mucous Membranes                          Head Non traumatic, neck supple                          Normal   Dentition 4. SKIN:  decreased Skin turgor,  Skin clean Dry and intact no rash 5. Heart: Regular rate and rhythm no  Murmur, no Rub or gallop 6. Lungs: no wheezes or crackles   7. Abdomen: Soft, non-tender, Non distended  obese   8. Lower extremities: no clubbing, cyanosis, no   edema 9. Neurologically Grossly intact, moving all 4 extremities equally  10. MSK: Normal range of motion   All other LABS:     Recent Labs  Lab 07/30/19 1606  WBC 8.6  NEUTROABS 3.9  HGB 16.6*  HCT 46.2*  MCV 88.5  PLT 167     Recent Labs  Lab 07/30/19 1606  NA 133*  K 2.7*  CL 94*  CO2 18*  GLUCOSE 151*  BUN 14  CREATININE 0.61  CALCIUM 9.2       Recent Labs  Lab 07/30/19 1606  AST 64*  ALT 85*  ALKPHOS 100  BILITOT 1.0  PROT 8.2*  ALBUMIN 4.7       Cultures: No results found for: SDES, SPECREQUEST, CULT, REPTSTATUS  Radiological Exams on Admission: DG Chest 2 View  Result Date: 07/30/2019 CLINICAL DATA:  54 year old female with shortness of breath. EXAM: CHEST - 2 VIEW COMPARISON:  None. FINDINGS: There is a faint area of increased density in the right upper lobe concerning for pneumonia. Clinical correlation and follow-up to resolution recommended. The left lung is clear. There is no pleural effusion or pneumothorax. The cardiac silhouette is within normal limits. Lower cervical ACDF. No acute osseous pathology. IMPRESSION: Right upper lobe pneumonia. Clinical correlation and follow-up to resolution recommended. Electronically Signed   By: Elgie Collard M.D.   On: 07/30/2019 16:40    Chart has been reviewed    Assessment/Plan  54 y.o. female with medical history significant of recent diagnosis of Covid , DM 2, GERD, HTN  Presented with shortness of breath Admitted for Covid infection    Present on Admission: . COVID-19 virus infection -  FROM HOME  WITH KNOWN HX OF COVID19 If evidence of Covid pneumonia on chest x-ray at this point no evidence of hypoxia. Following concerning LAB/ imaging findings:    LFTs: increased AST/ALT/Tbili     CXR: showing infiltrate   Laboratory markers  D-dimer ordered currently pending If D-dimer significantly elevated would consider CTA to rule out PE  Following complications noted:   elevated LFT's likely in the setting of COVID continue to follow   Plan of treatment: - Transfer to [facility] facility when bed is available spoke to Dr. Dwana Curd who is accepting of transfer    pharmacy consult for remdesivir   - Will follow daily d.dimer - Assess for ability to prone  - Supportive management -Fluid sparing resuscitation  -Provide oxygen as needed currently on  RA  SpO2:  94 % - IF d.dimer elvated >5 will increase dose of lovenox    Poor Prognostic factors  54 y.o.  Personal hx of  DM2  HTN,     Will order Airborne and Contact precautions   The treatment plan and use of medications and known side effects were discussed with patient It was clearly explained that there is no proven definitive treatment for COVID-19 infection yet. Any medications used here are based on case reports/anecdotal data which are not peer-reviewed and has not been studied using randomized control trials.  Complete risks and long-term side effects are unknown, however in the best clinical judgment they seem to be of some clinical benefit rather than medical risks.  Patient  agree with the treatment plan and want to receive these treatments as indicated.     Diabetes mellitus -order sliding scale and Levemir 7units as per Covid diabetes protocol, and Tradjenta  . Essential hypertension - hold Lisinopril/HCTZ in the setting of dehydration for tonight  Hypokalemia - - will replace and repeat in AM,  check magnesium level and replace as needed  . Elevated lactic acid level -most likely in the setting of dehydration rehydrate follow  History of hyperlipidemia continue home medications  Other plan as per orders.  DVT prophylaxis:   Lovenox     Code Status:  FULL CODE  as per patient   I had personally discussed CODE STATUS with patient   Family Communication:   Family not at  Bedside   Disposition Plan:         To home once workup is complete and patient is stable                         Admission  status:  ED Disposition    ED Disposition Condition Comment   Admit  [facility] [[id number][facility]  Level of Care: Telemetry [5]  Covid Evaluation: Confirmed COVID Positive  Diagnosis: COVID-19 virus infection [[id number]][[phone]]  Admitting Physician: Therisa DoyneUTOVA, Pryor Guettler [[id number]]  Attending Physician: Coletta MemosVERA, TRINITY [[id number]]       Obs   Level of care   tele   For 12H   Precautions: admitted as covid positive Airborne and Contact precautions    PPE: Used by the provider:   P100  eye Goggles,  Gloves  gown   Misty Graham 07/30/2019, 9:08 PM    [facility]     after 2 AM please page floor coverage PA If 7AM-7PM, please contact the day team taking care of the patient using [email or web address]   Patient was evaluated in the context of the global COVID-19 pandemic, which necessitated consideration that the patient might be at risk for infection with the SARS-CoV-2 virus that causes COVID-19. Institutional protocols and algorithms that pertain to the evaluation of patients at risk for COVID-19 are in a state of rapid change based on information released by regulatory bodies including the CDC and federal and state organizations. These policies and algorithms were followed during the patient's care.

## 2019-07-30 NOTE — ED Provider Notes (Signed)
[facility] Emergency Department Provider Note    First MD Initiated Contact with Patient 07/30/19 1739     (approximate)  I have reviewed the triage vital signs and the nursing notes.   HISTORY  Chief Complaint Shortness of Breath    HPI Misty Graham is a 54 y.o. female who presents to the ER for evaluation of crampy pain nausea patient recently tested positive for COVID-19 and was actually driving to the [facility] infusion clinic today when the symptoms started.  Denies any shortness of breath right now.  Symptoms have been ongoing for little bit less than a week.  Does have some lightheadedness when she stands up.  Has had some decreased p.o. intake.  No vomiting or diarrhea.    Past Medical History:  Diagnosis Date  . Depression   . Diabetes mellitus without complication (HCC)   . Family history of ovarian cancer    Pt is My Risk neg 5/16 except NBN VUS  . GERD (gastroesophageal reflux disease)   . Hypertension   . Iron deficiency    Family History  Problem Relation Age of Onset  . Lung cancer Mother   . COPD Father   . Hypertension Father   . Allergies Father   . Coronary artery disease Father   . Healthy Sister   . Heart attack Paternal Grandmother   . Ovarian cancer Paternal Grandmother   . Heart attack Paternal Grandfather   . Breast cancer Neg Hx    Past Surgical History:  Procedure Laterality Date  . BREAST BIOPSY Right 4-5 yrs ago   core - neg with clip  . BREAST SURGERY Right    biopsy; Dr. Evette Cristal  . CERVICAL DISCECTOMY  03/15/2010  . CESAREAN SECTION  1991  . COLONOSCOPY    . LASIK    . VAGINAL BIRTH AFTER CESAREAN SECTION  1993 and 1997   Patient Active Problem List   Diagnosis Date Noted  . COVID-19 virus infection 07/30/2019  . Overactive bladder 11/14/2016  . Neck pain on right side 10/21/2015  . Diabetes mellitus (HCC) 09/06/2015  . 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 Allergic rhinitis 07/18/2015  . Acid reflux  07/18/2015  . Restless leg 07/18/2015  . BP (high blood pressure) 04/04/2015  . Iron deficiency anemia 01/05/2015  . Cervical pain 10/29/2009      Prior to Admission medications   Medication Sig Start Date End Date Taking? Authorizing Provider  atorvastatin (LIPITOR) 40 MG tablet Take 40 mg by mouth daily. 03/17/19  Yes [provider]  cetirizine (ZYRTEC) 10 MG tablet Take 10 mg by mouth daily.    [provider]  desvenlafaxine (PRISTIQ) 50 MG 24 hr tablet Take by mouth. 11/20/18 11/20/19  [provider]  dexlansoprazole (DEXILANT) 60 MG capsule Take 1 capsule (60 mg total) by mouth daily. **PLEASE SCHEDULE YOUR YEARLY VISIT SOON** 07/01/19   Midge Minium, MD  FEROSUL 325 (65 Fe) MG tablet Take 1 tablet (325 mg total) by mouth daily with breakfast. 04/17/18   Maple Hudson., MD  glipiZIDE (GLUCOTROL XL) 5 MG 24 hr tablet Take 1 tablet (5 mg total) by mouth daily with breakfast. 04/17/18   Maple Hudson., MD  glucose blood [facility] VERIO) test strip To check blood sugar once daily 10/17/16   Joycelyn Man M, PA-C  hydrochlorothiazide (HYDRODIURIL) 12.5 MG tablet Take 12.5 mg by mouth daily. 05/13/19   [provider]  JANUVIA 100 MG tablet TAKE 1 TABLET  BY MOUTH EVERY DAY 02/26/18   Margaretann Loveless, PA-C  lisinopril (ZESTRIL) 10 MG tablet Take 10 mg by mouth daily. 05/13/19   [provider]  metFORMIN (GLUCOPHAGE-XR) 500 MG 24 hr tablet Take 500 mg by mouth 2 (two) times daily. 05/08/19   [provider]  mirabegron ER (MYRBETRIQ) 25 MG TB24 tablet Take 1 tablet (25 mg total) by mouth daily. 01/16/19   Nadara Mustard, MD  [facility] DELICA LANCETS FINE MISC To check blood sugar once a day DX:  E11.9 07/29/15   Lorie Phenix, MD    Allergies Patient has no known allergies.    Social History Social History   Tobacco Use  . Smoking status: Never Smoker  . Smokeless tobacco: Never Used  Substance Use Topics  .  Alcohol use: No  . Drug use: No    Review of Systems Patient denies headaches, rhinorrhea, blurry vision, numbness, shortness of breath, chest pain, edema, cough, abdominal pain, nausea, vomiting, diarrhea, dysuria, fevers, rashes or hallucinations unless otherwise stated above in HPI. ____________________________________________   PHYSICAL EXAM:  VITAL SIGNS: Vitals:   07/30/19 1557 07/30/19 1600  BP:  129/71  Pulse: (!) 120 (!) 112  Resp:  20  Temp: (!) 97.5 F (36.4 C)   SpO2: 96% 99%    Constitutional: Alert and oriented.  Eyes: Conjunctivae are normal.  Head: Atraumatic. Nose: No congestion/rhinnorhea. Mouth/Throat: Mucous membranes are dry   Neck: No stridor. Painless ROM.  Cardiovascular: Normal rate, regular rhythm. Grossly normal heart sounds.  Good peripheral circulation. Respiratory: Normal respiratory effort.  No retractions. Lungs CTAB. Gastrointestinal: Soft and nontender. No distention. No abdominal bruits. No CVA tenderness. Genitourinary:  Musculoskeletal: No lower extremity tenderness nor edema.  No joint effusions. Neurologic:  Normal speech and language. No gross focal neurologic deficits are appreciated. No facial droop Skin:  Skin is warm, dry and intact. No rash noted. Psychiatric: Mood and affect are normal. Speech and behavior are normal.  ____________________________________________   LABS (all labs ordered are listed, but only abnormal results are displayed)  Results for orders placed or performed during the hospital encounter of 07/30/19 (from the past 24 hour(s))  CBC with Differential     Status: Abnormal   Collection Time: 07/30/19  4:06 PM  Result Value Ref Range   WBC 8.6 4.0 - 10.5 K/uL   RBC 5.22 (H) 3.87 - 5.11 MIL/uL   Hemoglobin 16.6 (H) 12.0 - 15.0 g/dL   HCT 32.4 (H) 40.1 - 02.7 %   MCV 88.5 80.0 - 100.0 fL   MCH 31.8 26.0 - 34.0 pg   MCHC 35.9 30.0 - 36.0 g/dL   RDW 25.3 66.4 - 40.3 %   Platelets 167 150 - 400 K/uL   nRBC  0.0 0.0 - 0.2 %   Neutrophils Relative % 46 %   Neutro Abs 3.9 1.7 - 7.7 K/uL   Lymphocytes Relative 43 %   Lymphs Abs 3.7 0.7 - 4.0 K/uL   Monocytes Relative 10 %   Monocytes Absolute 0.8 0.1 - 1.0 K/uL   Eosinophils Relative 1 %   Eosinophils Absolute 0.1 0.0 - 0.5 K/uL   Basophils Relative 0 %   Basophils Absolute 0.0 0.0 - 0.1 K/uL   Immature Granulocytes 0 %   Abs Immature Granulocytes 0.02 0.00 - 0.07 K/uL  Comprehensive metabolic panel     Status: Abnormal   Collection Time: 07/30/19  4:06 PM  Result Value Ref Range   Sodium 133 (L) 135 -  145 mmol/L   Potassium 2.7 (LL) 3.5 - 5.1 mmol/L   Chloride 94 (L) 98 - 111 mmol/L   CO2 18 (L) 22 - 32 mmol/L   Glucose, Bld 151 (H) 70 - 99 mg/dL   BUN 14 6 - 20 mg/dL   Creatinine, Ser 0.61 0.44 - 1.00 mg/dL   Calcium 9.2 8.9 - 10.3 mg/dL   Total Protein 8.2 (H) 6.5 - 8.1 g/dL   Albumin 4.7 3.5 - 5.0 g/dL   AST 64 (H) 15 - 41 U/L   ALT 85 (H) 0 - 44 U/L   Alkaline Phosphatase 100 38 - 126 U/L   Total Bilirubin 1.0 0.3 - 1.2 mg/dL   GFR calc non Af Amer >60 >60 mL/min   GFR calc Af Amer >60 >60 mL/min   Anion gap 21 (H) 5 - 15  Lactic acid, plasma     Status: Abnormal   Collection Time: 07/30/19  6:57 PM  Result Value Ref Range   Lactic Acid, Venous 4.0 (HH) 0.5 - 1.9 mmol/L   ____________________________________________  EKG My review and personal interpretation at Time: 16:02   Indication: cramp  Rate: 100  Rhythm: sinus Axis: normal Other: normal intervals, no stemi ____________________________________________  RADIOLOGY  I personally reviewed all radiographic images ordered to evaluate for the above acute complaints and reviewed radiology reports and findings.  These findings were personally discussed with the patient.  Please see medical record for radiology report.  ____________________________________________   PROCEDURES  Procedure(s) performed:  Procedures    Critical Care performed:    ____________________________________________   INITIAL IMPRESSION / ASSESSMENT AND PLAN / ED COURSE  Pertinent labs & imaging results that were available during my care of the patient were reviewed by me and considered in my medical decision making (see chart for details).   DDX: acs, dysrhtymia, dehyration, pna, covid 19, pe, aki  JAQUETTA CURRIER is a 54 y.o. who presents to the ED with was as described above.  Patient clinically well-appearing but blood work sent for above the complaints do show evidence of hypokalemia and metabolic acidosis with significant lactic acidosis.  Does have infiltrate on chest x-ray but not requiring supplemental oxygen at this time.  Mildly tachycardic.  Given IV fluids as well as electrolytes repleted.  Discussed with hospitalist for admission.     The patient was evaluated in Emergency Department today for the symptoms described in the history of present illness. He/she was evaluated in the context of the global COVID-19 pandemic, which necessitated consideration that the patient might be at risk for infection with the SARS-CoV-2 virus that causes COVID-19. Institutional protocols and algorithms that pertain to the evaluation of patients at risk for COVID-19 are in a state of rapid change based on information released by regulatory bodies including the CDC and federal and state organizations. These policies and algorithms were followed during the patient's care in the ED.  As part of my medical decision making, I reviewed the following data within the [facility] notes reviewed and incorporated, Labs reviewed, notes from prior ED visits and [place] Controlled Substance Database   ____________________________________________   FINAL CLINICAL IMPRESSION(S) / ED DIAGNOSES  Final diagnoses:  COVID-19 virus infection  Hypokalemia  Dehydration      NEW MEDICATIONS STARTED DURING THIS VISIT:  New Prescriptions   No medications on file      Note:  This document was prepared using Dragon voice recognition software and may include unintentional dictation errors.  Willy Eddy, MD 07/30/19 2032

## 2019-07-30 NOTE — Progress Notes (Signed)
  I connected by phone with Misty Graham on 07/30/2019 at 1:23 PM to discuss the potential use of an new treatment for mild to moderate COVID-19 viral infection in non-hospitalized patients.  This patient is a 54 y.o. female that meets the FDA criteria for Emergency Use Authorization of bamlanivimab or casirivimab\imdevimab.  Has a (+) direct SARS-CoV-2 viral test result  Has mild or moderate COVID-19   Is ? 55 years of age and weighs ? 40 kg  Is NOT hospitalized due to COVID-19  Is NOT requiring oxygen therapy or requiring an increase in baseline oxygen flow rate due to COVID-19  Is within 10 days of symptom onset  Has at least one of the high risk factor(s) for progression to severe COVID-19 and/or hospitalization as defined in EUA.  Specific high risk criteria : Diabetes   I have spoken and communicated the following to the patient or parent/caregiver:  1. FDA has authorized the emergency use of bamlanivimab and casirivimab\imdevimab for the treatment of mild to moderate COVID-19 in adults and pediatric patients with positive results of direct SARS-CoV-2 viral testing who are 56 years of age and older weighing at least 40 kg, and who are at high risk for progressing to severe COVID-19 and/or hospitalization.  2. The significant known and potential risks and benefits of bamlanivimab and casirivimab\imdevimab, and the extent to which such potential risks and benefits are unknown.  3. Information on available alternative treatments and the risks and benefits of those alternatives, including clinical trials.  4. Patients treated with bamlanivimab and casirivimab\imdevimab should continue to self-isolate and use infection control measures (e.g., wear mask, isolate, social distance, avoid sharing personal items, clean and disinfect "high touch" surfaces, and frequent handwashing) according to CDC guidelines.   5. The patient or parent/caregiver has the option to accept or refuse  bamlanivimab or casirivimab\imdevimab .  After reviewing this information with the patient, The patient agreed to proceed with receiving the bamlanimivab infusion and will be provided a copy of the Fact sheet prior to receiving the infusion.[name] 07/30/2019 1:23 PM

## 2019-07-31 ENCOUNTER — Observation Stay (HOSPITAL_COMMUNITY)
Admission: AD | Admit: 2019-07-31 | Discharge: 2019-08-01 | Disposition: A | Discharge status: Home / Self Care | Payer: BC Managed Care – PPO | Source: Other Acute Inpatient Hospital | Attending: Internal Medicine | Admitting: Internal Medicine

## 2019-07-31 ENCOUNTER — Encounter (HOSPITAL_COMMUNITY): Payer: Self-pay | Admitting: Internal Medicine

## 2019-07-31 DIAGNOSIS — F329 Major depressive disorder, single episode, unspecified: Secondary | ICD-10-CM | POA: Insufficient documentation

## 2019-07-31 DIAGNOSIS — E119 Type 2 diabetes mellitus without complications: Secondary | ICD-10-CM | POA: Diagnosis not present

## 2019-07-31 DIAGNOSIS — R7989 Other specified abnormal findings of blood chemistry: Secondary | ICD-10-CM | POA: Diagnosis not present

## 2019-07-31 DIAGNOSIS — Z79899 Other long term (current) drug therapy: Secondary | ICD-10-CM | POA: Diagnosis not present

## 2019-07-31 DIAGNOSIS — R7402 Elevation of levels of lactic acid dehydrogenase (LDH): Secondary | ICD-10-CM | POA: Diagnosis not present

## 2019-07-31 DIAGNOSIS — I10 Essential (primary) hypertension: Secondary | ICD-10-CM | POA: Diagnosis not present

## 2019-07-31 DIAGNOSIS — K219 Gastro-esophageal reflux disease without esophagitis: Secondary | ICD-10-CM | POA: Diagnosis not present

## 2019-07-31 DIAGNOSIS — Z7984 Long term (current) use of oral hypoglycemic drugs: Secondary | ICD-10-CM | POA: Diagnosis not present

## 2019-07-31 DIAGNOSIS — U071 COVID-19: Secondary | ICD-10-CM | POA: Diagnosis present

## 2019-07-31 DIAGNOSIS — J1282 Pneumonia due to coronavirus disease 2019: Secondary | ICD-10-CM | POA: Insufficient documentation

## 2019-07-31 LAB — CBC WITH DIFFERENTIAL/PLATELET
Abs Immature Granulocytes: 0.01 10*3/uL (ref 0.00–0.07)
Basophils Absolute: 0 10*3/uL (ref 0.0–0.1)
Basophils Relative: 0 %
Eosinophils Absolute: 0 10*3/uL (ref 0.0–0.5)
Eosinophils Relative: 1 %
HCT: 38.6 % (ref 36.0–46.0)
Hemoglobin: 13.7 g/dL (ref 12.0–15.0)
Immature Granulocytes: 0 %
Lymphocytes Relative: 44 %
Lymphs Abs: 2.3 10*3/uL (ref 0.7–4.0)
MCH: 32.7 pg (ref 26.0–34.0)
MCHC: 35.5 g/dL (ref 30.0–36.0)
MCV: 92.1 fL (ref 80.0–100.0)
Monocytes Absolute: 0.5 10*3/uL (ref 0.1–1.0)
Monocytes Relative: 9 %
Neutro Abs: 2.4 10*3/uL (ref 1.7–7.7)
Neutrophils Relative %: 46 %
Platelets: 117 10*3/uL — ABNORMAL LOW (ref 150–400)
RBC: 4.19 MIL/uL (ref 3.87–5.11)
RDW: 11.7 % (ref 11.5–15.5)
WBC: 5.2 10*3/uL (ref 4.0–10.5)
nRBC: 0 % (ref 0.0–0.2)

## 2019-07-31 LAB — GLUCOSE, CAPILLARY
Glucose-Capillary: 140 mg/dL — ABNORMAL HIGH (ref 70–99)
Glucose-Capillary: 155 mg/dL — ABNORMAL HIGH (ref 70–99)
Glucose-Capillary: 167 mg/dL — ABNORMAL HIGH (ref 70–99)
Glucose-Capillary: 174 mg/dL — ABNORMAL HIGH (ref 70–99)
Glucose-Capillary: 174 mg/dL — ABNORMAL HIGH (ref 70–99)
Glucose-Capillary: 218 mg/dL — ABNORMAL HIGH (ref 70–99)

## 2019-07-31 LAB — COMPREHENSIVE METABOLIC PANEL
ALT: 62 U/L — ABNORMAL HIGH (ref 0–44)
AST: 34 U/L (ref 15–41)
Albumin: 3.7 g/dL (ref 3.5–5.0)
Alkaline Phosphatase: 82 U/L (ref 38–126)
Anion gap: 9 (ref 5–15)
BUN: 11 mg/dL (ref 6–20)
CO2: 25 mmol/L (ref 22–32)
Calcium: 8.2 mg/dL — ABNORMAL LOW (ref 8.9–10.3)
Chloride: 100 mmol/L (ref 98–111)
Creatinine, Ser: 0.54 mg/dL (ref 0.44–1.00)
GFR calc Af Amer: >60 mL/min (ref >60)
GFR calc non Af Amer: >60 mL/min (ref >60)
Glucose, Bld: 155 mg/dL — ABNORMAL HIGH (ref 70–99)
Potassium: 3.8 mmol/L (ref 3.5–5.1)
Sodium: 134 mmol/L — ABNORMAL LOW (ref 135–145)
Total Bilirubin: 0.8 mg/dL (ref 0.3–1.2)
Total Protein: 6.4 g/dL — ABNORMAL LOW (ref 6.5–8.1)

## 2019-07-31 LAB — D-DIMER, QUANTITATIVE: D-Dimer, Quant: 0.62 ug/mL-FEU — ABNORMAL HIGH (ref 0.00–0.50)

## 2019-07-31 LAB — SAMPLE TO BLOOD BANK

## 2019-07-31 LAB — HEMOGLOBIN A1C
Hgb A1c MFr Bld: 7.4 % — ABNORMAL HIGH (ref 4.8–5.6)
Mean Plasma Glucose: 165.68 mg/dL

## 2019-07-31 LAB — C-REACTIVE PROTEIN
CRP: 1 mg/dL — ABNORMAL HIGH (ref <1.0)
CRP: 1.2 mg/dL — ABNORMAL HIGH (ref <1.0)

## 2019-07-31 LAB — FERRITIN: Ferritin: 471 ng/mL — ABNORMAL HIGH (ref 11–307)

## 2019-07-31 MED ORDER — ENOXAPARIN SODIUM 40 MG/0.4ML ~~LOC~~ SOLN
40.0000 mg | SUBCUTANEOUS | Status: DC
  Administered 2019-07-31 – 2019-08-01 (×2): 40 mg via SUBCUTANEOUS
  Filled 2019-07-31 (×2): qty 0.4

## 2019-07-31 MED ORDER — HYDROCOD POLST-CPM POLST ER 10-8 MG/5ML PO SUER: 5.0000 mL | Freq: Two times a day (BID) | ORAL | Status: DC | PRN

## 2019-07-31 MED ORDER — ONDANSETRON HCL 4 MG PO TABS: 4.0000 mg | ORAL_TABLET | Freq: Four times a day (QID) | ORAL | Status: DC | PRN

## 2019-07-31 MED ORDER — GLUCERNA SHAKE PO LIQD
237.0000 mL | Freq: Two times a day (BID) | ORAL | Status: DC
  Administered 2019-07-31 – 2019-08-01 (×2): 237 mL via ORAL

## 2019-07-31 MED ORDER — INSULIN ASPART 100 UNIT/ML ~~LOC~~ SOLN: 0.0000 [IU] | Freq: Every day | SUBCUTANEOUS | Status: DC

## 2019-07-31 MED ORDER — ZINC SULFATE 220 (50 ZN) MG PO CAPS
220.0000 mg | ORAL_CAPSULE | Freq: Every day | ORAL | Status: DC
  Administered 2019-07-31 – 2019-08-01 (×2): 220 mg via ORAL
  Filled 2019-07-31 (×2): qty 1

## 2019-07-31 MED ORDER — ASCORBIC ACID 500 MG PO TABS
500.0000 mg | ORAL_TABLET | Freq: Every day | ORAL | Status: DC
  Administered 2019-07-31 – 2019-08-01 (×2): 500 mg via ORAL
  Filled 2019-07-31 (×2): qty 1

## 2019-07-31 MED ORDER — ACETAMINOPHEN 325 MG PO TABS
650.0000 mg | ORAL_TABLET | Freq: Four times a day (QID) | ORAL | Status: DC | PRN
  Administered 2019-07-31: 650 mg via ORAL
  Filled 2019-07-31: qty 2

## 2019-07-31 MED ORDER — DEXAMETHASONE 6 MG PO TABS
6.0000 mg | ORAL_TABLET | Freq: Every day | ORAL | Status: DC
  Administered 2019-07-31 – 2019-08-01 (×2): 6 mg via ORAL
  Filled 2019-07-31 (×2): qty 1

## 2019-07-31 MED ORDER — IPRATROPIUM-ALBUTEROL 20-100 MCG/ACT IN AERS
1.0000 | INHALATION_SPRAY | Freq: Four times a day (QID) | RESPIRATORY_TRACT | Status: DC
  Administered 2019-07-31 – 2019-08-01 (×5): 1 via RESPIRATORY_TRACT
  Filled 2019-07-31: qty 4

## 2019-07-31 MED ORDER — ONDANSETRON HCL 4 MG/2ML IJ SOLN: 4.0000 mg | Freq: Four times a day (QID) | INTRAMUSCULAR | Status: DC | PRN

## 2019-07-31 MED ORDER — DOCUSATE SODIUM 100 MG PO CAPS
100.0000 mg | ORAL_CAPSULE | Freq: Every day | ORAL | Status: DC
  Administered 2019-07-31 – 2019-08-01 (×2): 100 mg via ORAL
  Filled 2019-07-31 (×2): qty 1

## 2019-07-31 MED ORDER — GUAIFENESIN-DM 100-10 MG/5ML PO SYRP: 10.0000 mL | ORAL_SOLUTION | ORAL | Status: DC | PRN

## 2019-07-31 MED ORDER — SODIUM CHLORIDE 0.9 % IV SOLN: 100.0000 mg | Freq: Every day | INTRAVENOUS | Status: DC

## 2019-07-31 MED ORDER — INSULIN ASPART 100 UNIT/ML ~~LOC~~ SOLN
0.0000 [IU] | Freq: Three times a day (TID) | SUBCUTANEOUS | Status: DC
  Administered 2019-07-31 (×2): 3 [IU] via SUBCUTANEOUS
  Administered 2019-07-31: 5 [IU] via SUBCUTANEOUS
  Administered 2019-08-01: 2 [IU] via SUBCUTANEOUS

## 2019-07-31 NOTE — ED Notes (Signed)
Attempted x2 to call [facility]

## 2019-07-31 NOTE — Plan of Care (Signed)
Pt will remain free from falls and injury and VS remain stable throughout shift. 

## 2019-07-31 NOTE — Progress Notes (Signed)
Initial Nutrition Assessment  DOCUMENTATION CODES:   Not applicable  INTERVENTION:   Pt receiving Hormel Shake daily with Breakfast which provides 520 kcals and 22 g of protein and Magic cup BID with lunch and dinner, each supplement provides 290 kcal and 9 grams of protein, automatically on meal trays to optimize nutritional intake.   Glucerna Shake po BID, each supplement provides 220 kcal and 10 grams of protein   NUTRITION DIAGNOSIS:   Increased nutrient needs related to catabolic illness as evidenced by estimated needs.  GOAL:   Patient will meet greater than or equal to 90% of their needs  MONITOR:   PO intake, Labs, Supplement acceptance, Weight trends  REASON FOR ASSESSMENT:   Malnutrition Screening Tool    ASSESSMENT:   54 yo female admitted with pneumonia due to COVID-19 virus. PMH includes DM, GERD, HTN  RD working remotely.  Pt admitted for observation  No recorded po intake  Current weight 75 kg; noted weight of 78 kg in July 2020 (4% wt loss), 82 kg in July 2019 (8.5% wt loss)  Noted pt has received DM education previously by RD at [facility].   Labs: sodium 134 (L), CBGs 140-218 Meds: decadron, vitamin C, ss novolog, zinc sulfate   Diet Order:   Diet Order            Diet heart healthy/carb modified Room service appropriate? Yes; Fluid consistency: Thin  Diet effective now              EDUCATION NEEDS:   Education needs have been addressed  Skin:  Skin Assessment: Reviewed RN Assessment  Last BM:  2/4  Height:   Ht Readings from Last 1 Encounters:  07/31/19 5\' 6"  (1.676 m)    Weight:   Wt Readings from Last 1 Encounters:  07/31/19 75 kg    BMI:  Body mass index is 26.69 kg/m.  Estimated Nutritional Needs:   Kcal:  1800-2000 kcals  Protein:  90-100 g  Fluid:  >/= 1.8 L   09/28/19 MS, RDN, LDN, CNSC RD Pager Number and Weekend/On-Call After Hours Pager Located in [place]

## 2019-07-31 NOTE — Plan of Care (Signed)
  Problem: Clinical Measurements: Goal: Respiratory complications will improve Outcome: Progressing   

## 2019-07-31 NOTE — Progress Notes (Signed)
0140  Pt arrived via stretcher from [place]. NADN RA @ 94% Pt oriented to room telemetry monitor on. NSR No C/O needs met Will continue to monitor.

## 2019-07-31 NOTE — H&P (Signed)
TRH H&P   Patient Demographics:    Misty Graham, is a 54 y.o. female  MRN: [id number]   DOB - Sep 17, 1965  Admit Date - 07/31/2019  Outpatient Primary MD for the patient is Nadara Mustard, MD  Patient coming from: [facility]  No chief complaint on file.     HPI:    Misty Graham  is a 54 y.o. female, with NIDDM, GERD, HTN presents as a transfer from [facility] for management of SARS-CoV-2 infection.  Patient started experiencing URI symptoms over the weekend, she tested positive for SARS-CoV-2 on 07/28/2019, she was referred to [facility] for bamlanimivab infusion.  The infusion was scheduled for 07/30/2019, in route to the hospital she started having rigors and decided to take detour for [facility].  At [facility] she was noted to have hypokalemia, lactic acidosis, CXR revealed a faint area of increased density in the right upper lobe concerning for pneumonia and was referred to [facility].  She admits to intermittent cough which is not productive.  She denies any chest pain, palpitations, orthopnea, PND, lower extremity edema.  Denies any hemoptysis.  Reports low-grade fever.  No sick contacts.  No anosmia or ageusia.  Denies any nausea, vomiting, diarrhea.  Reports compliance with all her home medications.  No recent medication changes.   Review of systems:  Review of Systems:  Constitutional: negative for anorexia, chills, fatigue or fevers HEENT: negative for earaches, epistaxis, or sore throat Respiratory: negative for dyspnea, cough, or wheezing Cardiovascular: negative for chest pain, palpitations, or syncope GU: negative for dysuria, urinary frequency, urinary urgency, hematuria Gastrointestinal: negative for abdominal pain, constipation, diarrhea,  nausea or vomiting Musculoskeletal: negative for arthralgias, back pain or myalgias Neurological: negative for dizziness, headaches or weakness Behavioral/Psych: negative for suicidal or homicidal ideation Skin:negative for rash Heme: negative for bruises Endo: negative for hair loss, weight gain/loss  With Past History of the following :   Past Medical History:  Diagnosis Date  . Depression   . Diabetes mellitus without complication (HCC)   . Family history of ovarian cancer    Pt is My Risk neg 5/16 except NBN VUS  . GERD (gastroesophageal reflux disease)   . Hypertension   . Iron deficiency    Past Surgical History:  Procedure Laterality Date  . BREAST BIOPSY Right 4-5 yrs ago   core -  neg with clip  . BREAST SURGERY Right    biopsy; Dr. Jamal Collin  . CERVICAL DISCECTOMY  03/15/2010  . CESAREAN SECTION  1991  . COLONOSCOPY    . LASIK    . VAGINAL BIRTH AFTER CESAREAN SECTION  1993 and 1997    Social History:    Social History   Tobacco Use  . Smoking status: Never Smoker  . Smokeless tobacco: Never Used  Substance Use Topics  . Alcohol use: No     Family History :    Family History  Problem Relation Age of Onset  . Lung cancer Mother   . COPD Father   . Hypertension Father   . Allergies Father   . Coronary artery disease Father   . Healthy Sister   . Heart attack Paternal Grandmother   . Ovarian cancer Paternal Grandmother   . Heart attack Paternal Grandfather   . Breast cancer Neg Hx     Home Medications:   Prior to Admission medications   Medication Sig Start Date End Date Taking? Authorizing Provider  atorvastatin (LIPITOR) 40 MG tablet Take 40 mg by mouth daily. 03/17/19   [provider]  cetirizine (ZYRTEC) 10 MG tablet Take 10 mg by mouth daily.    [provider]  desvenlafaxine (PRISTIQ) 50 MG 24 hr tablet Take 50 mg by mouth daily.  11/20/18 11/20/19  [provider]  dexlansoprazole (DEXILANT) 60 MG capsule Take 1  capsule (60 mg total) by mouth daily. **PLEASE SCHEDULE YOUR YEARLY VISIT SOON** 07/01/19   Lucilla Lame, MD  FEROSUL 325 (65 Fe) MG tablet Take 1 tablet (325 mg total) by mouth daily with breakfast. 04/17/18   Jerrol Banana., MD  glipiZIDE (GLUCOTROL XL) 5 MG 24 hr tablet Take 1 tablet (5 mg total) by mouth daily with breakfast. 04/17/18   Jerrol Banana., MD  glucose blood [facility] VERIO) test strip To check blood sugar once daily 10/17/16   Fenton Malling M, PA-C  hydrochlorothiazide (HYDRODIURIL) 12.5 MG tablet Take 12.5 mg by mouth daily. 05/13/19   [provider]  JANUVIA 100 MG tablet TAKE 1 TABLET BY MOUTH EVERY DAY Patient taking differently: Take 100 mg by mouth daily.  02/26/18   Mar Daring, PA-C  lisinopril (ZESTRIL) 10 MG tablet Take 10 mg by mouth daily. 05/13/19   [provider]  metFORMIN (GLUCOPHAGE-XR) 500 MG 24 hr tablet Take 500 mg by mouth 2 (two) times daily. 05/08/19   [provider]  mirabegron ER (MYRBETRIQ) 25 MG TB24 tablet Take 1 tablet (25 mg total) by mouth daily. 01/16/19   Gae Dry, MD  [facility] DELICA LANCETS FINE MISC To check blood sugar once a day DX:  E11.9 07/29/15   Margarita Rana, MD    Allergies:    No Known Allergies   Physical Exam:   Vitals  Blood pressure (!) 124/91, pulse 93, temperature 98.8 F (37.1 C), temperature source Oral, resp. rate 16, height 5\' 6"  (1.676 m), weight 75 kg, last menstrual period 07/18/2010, SpO2 94 %.  Physical Exam   Constitutional - resting comfortably, no acute distress Eyes - pupils equal round and reactive to light and accomodation, extra ocular movements intact Nose - no gross deformity or drainage Mouth - no oral lesions noted Throat - no swelling or erythema Neck - supple, no JVD   CV - (+)S1S2, no murmurs  Resp - CTA bilaterally, no wheezing or crackles,  GI - (+)BS, soft, non-tender, non-distended  Extrem - no clubbing, cyanosis, or peripheral  edema  Skin - no rashes or wounds Neuro - alert, aware, oriented to person/place/time  Psych - normal affect, no anxiety   Patient has Pressure Ulcer on Admission?: no   Data Review:    CBC Recent Labs  Lab 07/30/19 1606 07/30/19 1944  WBC 8.6 5.9  HGB 16.6* 15.1*  HCT 46.2* 42.1  PLT 167 128*  MCV 88.5 88.8  MCH 31.8 31.9  MCHC 35.9 35.9  RDW 11.5 11.5  LYMPHSABS 3.7 2.2  MONOABS 0.8 0.5  EOSABS 0.1 0.0  BASOSABS 0.0 0.0   ------------------------------------------------------------------------------------------------------------------  Chemistries  Recent Labs  Lab 07/30/19 1606 07/30/19 1910 07/30/19 2005  NA 133* 131*  --   K 2.7* 3.2*  --   CL 94* 96*  --   CO2 18* 22  --   GLUCOSE 151* 121*  --   BUN 14 14  --   CREATININE 0.61 0.53  --   CALCIUM 9.2 8.7*  --   MG  --   --  2.2  AST 64*  --   --   ALT 85*  --   --   ALKPHOS 100  --   --   BILITOT 1.0  --   --    ------------------------------------------------------------------------------------------------------------------ estimated creatinine clearance is 83.3 mL/min (by C-G formula based on SCr of 0.53 mg/dL). ------------------------------------------------------------------------------------------------------------------ No results for input(s): TSH, T4TOTAL, T3FREE, THYROIDAB in the last 72 hours.  Invalid input(s): FREET3  Coagulation profile No results for input(s): INR, PROTIME in the last 168 hours. ------------------------------------------------------------------------------------------------------------------- No results for input(s): DDIMER in the last 72 hours. -------------------------------------------------------------------------------------------------------------------  Cardiac Enzymes No results for input(s): CKMB, TROPONINI, MYOGLOBIN in the last 168 hours.  Invalid input(s):  CK ------------------------------------------------------------------------------------------------------------------ No results found for: BNP   ---------------------------------------------------------------------------------------------------------------  Urinalysis No results found for: COLORURINE, APPEARANCEUR, LABSPEC, PHURINE, GLUCOSEU, HGBUR, BILIRUBINUR, KETONESUR, PROTEINUR, UROBILINOGEN, NITRITE, LEUKOCYTESUR  ----------------------------------------------------------------------------------------------------------------   Imaging Results:    DG Chest 2 View  Result Date: 07/30/2019 CLINICAL DATA:  54 year old female with shortness of breath. EXAM: CHEST - 2 VIEW COMPARISON:  None. FINDINGS: There is a faint area of increased density in the right upper lobe concerning for pneumonia. Clinical correlation and follow-up to resolution recommended. The left lung is clear. There is no pleural effusion or pneumothorax. The cardiac silhouette is within normal limits. Lower cervical ACDF. No acute osseous pathology. IMPRESSION: Right upper lobe pneumonia. Clinical correlation and follow-up to resolution recommended. Electronically Signed   By: Elgie Collard M.D.   On: 07/30/2019 16:40    Assessment & Plan:    Principal Problem:   Pneumonia due to COVID-19 virus Active Problems:   Diabetes mellitus (HCC)   Essential hypertension   Elevated lactic acid level    Pneumonia due to COVID-19 virus: Patient developed URI symptoms a few days ago, tested positive for SARS-CoV-2 started chills and rigors in route to bamlanimivab fusion. Date of Dx: 07/28/2019 Oxygen requirements: RA Antibiotics: Not indicated, WBC WNL, procalcitonin WNL. Diuretics: Clinically euvolemic requiring supplemental oxygen not indicated at this time Vitamin C and Zinc: Per protocol Remdesivir: She received a dose at [facility] will hold off further doses given she is on RA. Steroids: Dexamethasone started  07/30/2019 Actemra: Not given yet Convalescent Plasma: Not given yet    Diabetes mellitus (HCC): Diabetic diet.  Holding oral diabetes medications.  Sliding scale insulin.    Essential hypertension: Blood pressure is close to goal.  Resume her home medications when verified  by pharmacy.    Elevated lactic acid level: Patient reports she was shaking and having rigors prior to the ER presentation, lactic acidosis likely related to the shaking.  Unlikely to be sepsis given normal WBC and procalcitonin.  Replenish potassium   DVT Prophylaxis Lovenox  AM Labs Ordered, also please review Full Orders  Family Communication: Admission, patients condition and plan of care including tests being ordered have been discussed with the patient who indicate understanding and agree with the plan and Code Status.  Code Status full code  Likely DC to home  Condition GUARDED    Consults called: None  Admission status: Place in observation  Time spent in minutes : 79   Coletta Memos M.D on 07/31/2019 at 3:40 AM  To page go to [email or web address] - password [facility]

## 2019-07-31 NOTE — Progress Notes (Signed)
1900 Report received and care assumed from Rasheen R RN. Pt resting in bed with call bell in reach NADN Needs met. Will continue to monitor 

## 2019-07-31 NOTE — Progress Notes (Signed)
SATURATION QUALIFICATIONS: (This note is used to comply with regulatory documentation for home oxygen)  Patient Saturations on Room Air at Rest = 94%  Patient Saturations on Room Air while Ambulating = 93-98%  Patient Saturations on NA Liters of oxygen while Ambulating = NA%  Please briefly explain why patient needs home oxygen: Patient does not require home oxygen.

## 2019-07-31 NOTE — Progress Notes (Signed)
Ambulation Note  Saturation Pre: 94% on RA  Ambulation Distance: 460 ft  Saturation During Ambulation: 93-98% on RA  Notes: Pt walked independently. Tolerated well. Pt returned to bed with call bell within reach, SpO2 96% on RA.  Alisia Ferrari, MS, ACSM CEP 11:58 AM 07/31/2019

## 2019-08-01 DIAGNOSIS — J1282 Pneumonia due to Coronavirus disease 2019: Secondary | ICD-10-CM | POA: Diagnosis not present

## 2019-08-01 DIAGNOSIS — U071 COVID-19: Principal | ICD-10-CM

## 2019-08-01 LAB — COMPREHENSIVE METABOLIC PANEL
ALT: 51 U/L — ABNORMAL HIGH (ref 0–44)
AST: 25 U/L (ref 15–41)
Albumin: 4.1 g/dL (ref 3.5–5.0)
Alkaline Phosphatase: 79 U/L (ref 38–126)
Anion gap: 11 (ref 5–15)
BUN: 15 mg/dL (ref 6–20)
CO2: 25 mmol/L (ref 22–32)
Calcium: 8.7 mg/dL — ABNORMAL LOW (ref 8.9–10.3)
Chloride: 102 mmol/L (ref 98–111)
Creatinine, Ser: 0.51 mg/dL (ref 0.44–1.00)
GFR calc Af Amer: >60 mL/min (ref >60)
GFR calc non Af Amer: >60 mL/min (ref >60)
Glucose, Bld: 140 mg/dL — ABNORMAL HIGH (ref 70–99)
Potassium: 3.6 mmol/L (ref 3.5–5.1)
Sodium: 138 mmol/L (ref 135–145)
Total Bilirubin: 0.9 mg/dL (ref 0.3–1.2)
Total Protein: 6.9 g/dL (ref 6.5–8.1)

## 2019-08-01 LAB — CBC WITH DIFFERENTIAL/PLATELET
Abs Immature Granulocytes: 0.01 10*3/uL (ref 0.00–0.07)
Basophils Absolute: 0 10*3/uL (ref 0.0–0.1)
Basophils Relative: 0 %
Eosinophils Absolute: 0 10*3/uL (ref 0.0–0.5)
Eosinophils Relative: 0 %
HCT: 38.3 % (ref 36.0–46.0)
Hemoglobin: 13.3 g/dL (ref 12.0–15.0)
Immature Granulocytes: 0 %
Lymphocytes Relative: 53 %
Lymphs Abs: 2.2 10*3/uL (ref 0.7–4.0)
MCH: 31.8 pg (ref 26.0–34.0)
MCHC: 34.7 g/dL (ref 30.0–36.0)
MCV: 91.6 fL (ref 80.0–100.0)
Monocytes Absolute: 0.5 10*3/uL (ref 0.1–1.0)
Monocytes Relative: 11 %
Neutro Abs: 1.5 10*3/uL — ABNORMAL LOW (ref 1.7–7.7)
Neutrophils Relative %: 36 %
Platelets: 127 10*3/uL — ABNORMAL LOW (ref 150–400)
RBC: 4.18 MIL/uL (ref 3.87–5.11)
RDW: 11.5 % (ref 11.5–15.5)
WBC: 4.1 10*3/uL (ref 4.0–10.5)
nRBC: 0 % (ref 0.0–0.2)

## 2019-08-01 LAB — GLUCOSE, CAPILLARY: Glucose-Capillary: 127 mg/dL — ABNORMAL HIGH (ref 70–99)

## 2019-08-01 LAB — FERRITIN: Ferritin: 418 ng/mL — ABNORMAL HIGH (ref 11–307)

## 2019-08-01 LAB — D-DIMER, QUANTITATIVE: D-Dimer, Quant: 0.52 ug/mL-FEU — ABNORMAL HIGH (ref 0.00–0.50)

## 2019-08-01 LAB — C-REACTIVE PROTEIN: CRP: 0.8 mg/dL (ref <1.0)

## 2019-08-01 MED ORDER — ZINC SULFATE 220 (50 ZN) MG PO CAPS: 220.0000 mg | ORAL_CAPSULE | Freq: Every day | ORAL | 0 refills | Status: AC

## 2019-08-01 MED ORDER — DEXAMETHASONE 6 MG PO TABS: 6.0000 mg | ORAL_TABLET | Freq: Every day | ORAL | 0 refills | Status: AC

## 2019-08-01 MED ORDER — IPRATROPIUM-ALBUTEROL 20-100 MCG/ACT IN AERS: 1.0000 | INHALATION_SPRAY | Freq: Four times a day (QID) | RESPIRATORY_TRACT | 0 refills | Status: DC | PRN

## 2019-08-01 MED ORDER — ASCORBIC ACID 500 MG PO TABS: 500.0000 mg | ORAL_TABLET | Freq: Every day | ORAL | 0 refills | Status: AC

## 2019-08-01 NOTE — Plan of Care (Signed)
  Problem: Clinical Measurements: Goal: Respiratory complications will improve Outcome: Progressing   

## 2019-08-01 NOTE — Discharge Summary (Signed)
Physician Discharge Summary  Misty Graham UMP:[id number] DOB: 07/02/1965 DOA: 07/31/2019  PCP: Nadara Mustard, MD  Admit date: 07/31/2019 Discharge date: 08/01/2019  Admitted From: Home Disposition:  Home  Recommendations for Outpatient Follow-up:  1. Follow up with PCP in 1-2 weeks 2. Continue Decadron 6 mg p.o. daily to complete a 10-day course 3. Combivent as needed for wheezing/shortness of breath 4. Continue zinc and vitamin C x30 days  Home Health: No Equipment/Devices: None  Discharge Condition: Stable CODE STATUS: Full code Diet recommendation: Heart healthy/consistent carbohydrate diet  History of present illness:  Misty Graham  is a 54 y.o. female, with NIDDM, GERD, HTN presents as a transfer from [facility] for management of SARS-CoV-2 infection.  Patient started experiencing URI symptoms over the weekend, she tested positive for SARS-CoV-2 on 07/28/2019, she was referred to [facility] for bamlanimivab infusion.  The infusion was scheduled for 07/30/2019, in route to the hospital she started having rigors and decided to take detour for [facility].  At [facility] she was noted to have hypokalemia, lactic acidosis, CXR revealed a faint area of increased density in the right upper lobe concerning for pneumonia and was referred to [facility].  She admits to intermittent cough which is not productive.  She denies any chest pain, palpitations, orthopnea, PND, lower extremity edema. Denies any hemoptysis.  Reports low-grade fever.  No sick contacts.  No anosmia or ageusia. Denies any nausea, vomiting, diarrhea.  Reports compliance with all her home medications.  No recent medication changes.  Hospital course:  Acute Covid-19 viral infection during the ongoing 2020 Covid 19 Pandemic - POA Patient presented as a transfer from [facility] after she was in around 2 initiate treatment with bamlanimvab infusion at [facility] infusion clinic; but prior to presentation she started to have chills, rigors and ill  feeling.  Patient had been oxygenating well on room air.  Patient was afebrile without leukocytosis.  Chest x-ray was notable for multifocal patchy groundglass opacities preliminary in the lower lung fields.  She tested positive for Covid-19 on 07/28/2019.  Patient was started on Decadron 6 mg p.o. daily.  Patient CRP improved from 1.2-0.8 with D-dimer 0.62-0.52.  She remained on room air and no signs of hypoxia with ambulation.  Patient ready for discharge home. Continue Decadron 6 mg p.o. daily to complete a 10-day course.  Continue zinc and vitamin C.  Combivent MDI every 6 hours as needed for shortness of breath, wheezing.  Non-insulin-dependent diabetes mellitus Hemoglobin A1c 7.4 on 07/31/2019, fairly well controlled.  Continue home glipizide 5 mg p.o. daily, Januvia 100 mg p.o. daily, Metformin 1000 mg p.o. daily.  Essential hypertension Continue home HCTZ 12.5 mg p.o. daily, lisinopril 10 mg p.o. daily.  GERD: Continue PPI  Discharge Diagnoses:  Principal Problem:   Pneumonia due to COVID-19 virus Active Problems:   Diabetes mellitus (HCC)   Essential hypertension   Elevated lactic acid level    Discharge Instructions  Discharge Instructions    Call MD for:  difficulty breathing, headache or visual disturbances   Complete by: As directed    Call MD for:  extreme fatigue   Complete by: As directed    Call MD for:  persistant dizziness or light-headedness   Complete by: As directed    Call MD for:  persistant nausea and vomiting   Complete by: As directed    Call MD for:  severe uncontrolled pain   Complete by: As directed    Call MD for:  temperature >  100.4   Complete by: As directed    Diet - low sodium heart healthy   Complete by: As directed    Increase activity slowly   Complete by: As directed      Allergies as of 08/01/2019   No Known Allergies     Medication List    TAKE these medications   ascorbic acid 500 MG tablet Commonly known as: VITAMIN C Take 1  tablet (500 mg total) by mouth daily. Start taking on: August 02, 2019   atorvastatin 40 MG tablet Commonly known as: LIPITOR Take 40 mg by mouth daily.   cetirizine 10 MG tablet Commonly known as: ZYRTEC Take 10 mg by mouth daily.   desvenlafaxine 50 MG 24 hr tablet Commonly known as: PRISTIQ Take 50 mg by mouth daily.   dexamethasone 6 MG tablet Commonly known as: DECADRON Take 1 tablet (6 mg total) by mouth daily at 6 (six) AM for 8 days. Start taking on: August 03, 2019   Dexilant 60 MG capsule Generic drug: dexlansoprazole Take 1 capsule (60 mg total) by mouth daily. **PLEASE SCHEDULE YOUR YEARLY VISIT SOON**   FeroSul 325 (65 FE) MG tablet Generic drug: ferrous sulfate Take 1 tablet (325 mg total) by mouth daily with breakfast. What changed: See the new instructions.   glipiZIDE 5 MG 24 hr tablet Commonly known as: GLUCOTROL XL Take 1 tablet (5 mg total) by mouth daily with breakfast.   glucose blood test strip Commonly known as: OneTouch Verio To check blood sugar once daily   hydrochlorothiazide 12.5 MG tablet Commonly known as: HYDRODIURIL Take 12.5 mg by mouth daily.   Ipratropium-Albuterol 20-100 MCG/ACT Aers respimat Commonly known as: COMBIVENT Inhale 1 puff into the lungs every 6 (six) hours as needed for wheezing.   Januvia 100 MG tablet Generic drug: sitaGLIPtin TAKE 1 TABLET BY MOUTH EVERY DAY What changed: how much to take   lisinopril 10 MG tablet Commonly known as: ZESTRIL Take 10 mg by mouth daily.   metFORMIN 500 MG 24 hr tablet Commonly known as: GLUCOPHAGE-XR Take 1,000 mg by mouth daily with breakfast.   mirabegron ER 25 MG Tb24 tablet Commonly known as: Myrbetriq Take 1 tablet (25 mg total) by mouth daily.   OneTouch Delica Lancets Fine Misc To check blood sugar once a day DX:  E11.9   zinc sulfate 220 (50 Zn) MG capsule Take 1 capsule (220 mg total) by mouth daily. Start taking on: August 02, 2019      Follow-up  Information    Nadara Mustard, MD. Call in 1 week(s).   Specialty: Obstetrics and Gynecology Contact information: [address] [place] [address] [phone]          No Known Allergies  Consultations:  None   Procedures/Studies: DG Chest 2 View  Result Date: 07/30/2019 CLINICAL DATA:  54 year old female with shortness of breath. EXAM: CHEST - 2 VIEW COMPARISON:  None. FINDINGS: There is a faint area of increased density in the right upper lobe concerning for pneumonia. Clinical correlation and follow-up to resolution recommended. The left lung is clear. There is no pleural effusion or pneumothorax. The cardiac silhouette is within normal limits. Lower cervical ACDF. No acute osseous pathology. IMPRESSION: Right upper lobe pneumonia. Clinical correlation and follow-up to resolution recommended. Electronically Signed   By: Elgie Collard M.D.   On: 07/30/2019 16:40      Subjective: Patient seen and examined at bedside, resting comfortably.  States that her rigors, chills have  resolved.  No active complaints this morning.  Oxygen well on room air, even with exertion/ambulation.  Wishes to discharge home this morning.  Denies headache, no fever/chills/night sweats, no nausea/vomiting/diarrhea, no chest pain, palpitations, no shortness of breath, no abdominal pain.  No acute events overnight per nursing staff.  Discharge Exam: Vitals:   08/01/19 0410 08/01/19 0729  BP: 132/77 115/67  Pulse: 87 86  Resp: 20 20  Temp: 98.1 F (36.7 C) 98.5 F (36.9 C)  SpO2: 93% 97%   Vitals:   07/31/19 1605 07/31/19 1926 08/01/19 0410 08/01/19 0729  BP: 129/85  132/77 115/67  Pulse: 92 85 87 86  Resp:  18 20 20   Temp: 98.7 F (37.1 C) 99.3 F (37.4 C) 98.1 F (36.7 C) 98.5 F (36.9 C)  TempSrc: Oral Oral Oral Oral  SpO2: 95%  93% 97%  Weight:      Height:        General: Pt is alert, awake, not in acute distress Cardiovascular: RRR, S1/S2 +, no rubs, no  gallops Respiratory: CTA bilaterally, no wheezing, no rhonchi, oxygenating well on room air Abdominal: Soft, NT, ND, bowel sounds + Extremities: no edema, no cyanosis    The results of significant diagnostics from this hospitalization (including imaging, microbiology, ancillary and laboratory) are listed below for reference.     Microbiology: Recent Results (from the past 240 hour(s))  Novel Coronavirus, NAA ([facility])     Status: Abnormal   Collection Time: 07/28/19  8:16 AM   Specimen: Nasopharyngeal(NP) swabs in vial transport medium   NASOPHARYNGE  TESTING  Result Value Ref Range Status   SARS-CoV-2, NAA Detected (A) Not Detected Final    Comment: This nucleic acid amplification test was developed and its performance characteristics determined by [employer]. Nucleic acid amplification tests include RT-PCR and TMA. This test has not been FDA cleared or approved. This test has been authorized by FDA under an Emergency Use Authorization (EUA). This test is only authorized for the duration of time the declaration that circumstances exist justifying the authorization of the emergency use of in vitro diagnostic tests for detection of SARS-CoV-2 virus and/or diagnosis of COVID-19 infection under section 564(b)(1) of the Act, 21 U.S.C. 270WCB-7(S) (1), unless the authorization is terminated or revoked sooner. When diagnostic testing is negative, the possibility of a false negative result should be considered in the context of a patient's recent exposures and the presence of clinical signs and symptoms consistent with COVID-19. An individual without symptoms of COVID-19 and who is not shedding SARS-CoV-2 virus wo uld expect to have a negative (not detected) result in this assay.   Blood Culture (routine x 2)     Status: None (Preliminary result)   Collection Time: 07/30/19  8:34 PM   Specimen: BLOOD  Result Value Ref Range Status   Specimen Description BLOOD BLOOD RIGHT  WRIST  Final   Special Requests   Final    BOTTLES DRAWN AEROBIC AND ANAEROBIC Blood Culture adequate volume   Culture   Final    NO GROWTH < 12 HOURS Performed at [facility], [address] [place], [place] [address]    Report Status PENDING  Incomplete  Blood Culture (routine x 2)     Status: None (Preliminary result)   Collection Time: 07/30/19  8:34 PM   Specimen: BLOOD  Result Value Ref Range Status   Specimen Description BLOOD BLOOD LEFT FOREARM  Final   Special Requests   Final    BOTTLES  DRAWN AEROBIC AND ANAEROBIC Blood Culture adequate volume   Culture   Final    NO GROWTH < 12 HOURS Performed at [facility], [address] [place], [place] [address]    Report Status PENDING  Incomplete     Labs: BNP (last 3 results) No results for input(s): BNP in the last 8760 hours. Basic Metabolic Panel: Recent Labs  Lab 07/30/19 1606 07/30/19 1910 07/30/19 2005 07/31/19 0415 08/01/19 0056  NA 133* 131*  --  134* 138  K 2.7* 3.2*  --  3.8 3.6  CL 94* 96*  --  100 102  CO2 18* 22  --  25 25  GLUCOSE 151* 121*  --  155* 140*  BUN 14 14  --  11 15  CREATININE 0.61 0.53  --  0.54 0.51  CALCIUM 9.2 8.7*  --  8.2* 8.7*  MG  --   --  2.2  --   --    Liver Function Tests: Recent Labs  Lab 07/30/19 1606 07/31/19 0415 08/01/19 0056  AST 64* 34 25  ALT 85* 62* 51*  ALKPHOS 100 82 79  BILITOT 1.0 0.8 0.9  PROT 8.2* 6.4* 6.9  ALBUMIN 4.7 3.7 4.1   No results for input(s): LIPASE, AMYLASE in the last 168 hours. No results for input(s): AMMONIA in the last 168 hours. CBC: Recent Labs  Lab 07/30/19 1606 07/30/19 1944 07/31/19 0415 08/01/19 0056  WBC 8.6 5.9 5.2 4.1  NEUTROABS 3.9 3.2 2.4 1.5*  HGB 16.6* 15.1* 13.7 13.3  HCT 46.2* 42.1 38.6 38.3  MCV 88.5 88.8 92.1 91.6  PLT 167 128* 117* 127*   Cardiac Enzymes: No results for input(s): CKTOTAL, CKMB, CKMBINDEX, TROPONINI in the last 168 hours. BNP: Invalid input(s): POCBNP CBG: Recent  Labs  Lab 07/31/19 0728 07/31/19 1127 07/31/19 1606 07/31/19 2049 07/31/19 2145  GLUCAP 174* 218* 174* 167* 155*   D-Dimer Recent Labs    07/31/19 0415 08/01/19 0056  DDIMER 0.62* 0.52*   Hgb A1c Recent Labs    07/31/19 0415  HGBA1C 7.4*   Lipid Profile Recent Labs    07/30/19 1944  TRIG 146   Thyroid function studies No results for input(s): TSH, T4TOTAL, T3FREE, THYROIDAB in the last 72 hours.  Invalid input(s): FREET3 Anemia work up [employer]    07/31/19 0415 08/01/19 0056  FERRITIN 471* 418*   Urinalysis No results found for: COLORURINE, APPEARANCEUR, LABSPEC, PHURINE, GLUCOSEU, HGBUR, BILIRUBINUR, KETONESUR, PROTEINUR, UROBILINOGEN, NITRITE, LEUKOCYTESUR Sepsis Labs Invalid input(s): PROCALCITONIN,  WBC,  LACTICIDVEN Microbiology Recent Results (from the past 240 hour(s))  Novel Coronavirus, NAA ([facility])     Status: Abnormal   Collection Time: 07/28/19  8:16 AM   Specimen: Nasopharyngeal(NP) swabs in vial transport medium   NASOPHARYNGE  TESTING  Result Value Ref Range Status   SARS-CoV-2, NAA Detected (A) Not Detected Final    Comment: This nucleic acid amplification test was developed and its performance characteristics determined by [employer]. Nucleic acid amplification tests include RT-PCR and TMA. This test has not been FDA cleared or approved. This test has been authorized by FDA under an Emergency Use Authorization (EUA). This test is only authorized for the duration of time the declaration that circumstances exist justifying the authorization of the emergency use of in vitro diagnostic tests for detection of SARS-CoV-2 virus and/or diagnosis of COVID-19 infection under section 564(b)(1) of the Act, 21 U.S.C. 818HUD-1(S) (1), unless the authorization is terminated or revoked sooner. When diagnostic testing is  negative, the possibility of a false negative result should be considered in the context of a patient's recent exposures  and the presence of clinical signs and symptoms consistent with COVID-19. An individual without symptoms of COVID-19 and who is not shedding SARS-CoV-2 virus wo uld expect to have a negative (not detected) result in this assay.   Blood Culture (routine x 2)     Status: None (Preliminary result)   Collection Time: 07/30/19  8:34 PM   Specimen: BLOOD  Result Value Ref Range Status   Specimen Description BLOOD BLOOD RIGHT WRIST  Final   Special Requests   Final    BOTTLES DRAWN AEROBIC AND ANAEROBIC Blood Culture adequate volume   Culture   Final    NO GROWTH < 12 HOURS Performed at [facility], [address], [place], [place] [address]    Report Status PENDING  Incomplete  Blood Culture (routine x 2)     Status: None (Preliminary result)   Collection Time: 07/30/19  8:34 PM   Specimen: BLOOD  Result Value Ref Range Status   Specimen Description BLOOD BLOOD LEFT FOREARM  Final   Special Requests   Final    BOTTLES DRAWN AEROBIC AND ANAEROBIC Blood Culture adequate volume   Culture   Final    NO GROWTH < 12 HOURS Performed at [facility], [address] [place], [place] [address]    Report Status PENDING  Incomplete     Time coordinating discharge: Over 30 minutes  SIGNED:   Alvira Philips [place], DO  [facility] 08/01/2019, 8:04 AM

## 2019-08-01 NOTE — Discharge Instructions (Signed)
Person Under Monitoring Name: Misty Graham  Location: [place] [id number]   Infection Prevention Recommendations for Individuals Confirmed to have, or Being Evaluated for, 2019 Novel Coronavirus (COVID-19) Infection Who Receive Care at Home  Individuals who are confirmed to have, or are being evaluated for, COVID-19 should follow the prevention steps below until a healthcare provider or local or state health department says they can return to normal activities.  Stay home except to get medical care You should restrict activities outside your home, except for getting medical care. Do not go to work, school, or public areas, and do not use public transportation or taxis.  Call ahead before visiting your doctor Before your medical appointment, call the healthcare provider and tell them that you have, or are being evaluated for, COVID-19 infection. This will help the healthcare provider's office take steps to keep other people from getting infected. Ask your healthcare provider to call the local or state health department.  Monitor your symptoms Seek prompt medical attention if your illness is worsening (e.g., difficulty breathing). Before going to your medical appointment, call the healthcare provider and tell them that you have, or are being evaluated for, COVID-19 infection. Ask your healthcare provider to call the local or state health department.  Wear a facemask You should wear a facemask that covers your nose and mouth when you are in the same room with other people and when you visit a healthcare provider. People who live with or visit you should also wear a facemask while they are in the same room with you.  Separate yourself from other people in your home As much as possible, you should stay in a different room from other people in your home. Also, you should use a separate bathroom, if available.  Avoid sharing household items You should not share  dishes, drinking glasses, cups, eating utensils, towels, bedding, or other items with other people in your home. After using these items, you should wash them thoroughly with soap and water.  Cover your coughs and sneezes Cover your mouth and nose with a tissue when you cough or sneeze, or you can cough or sneeze into your sleeve. Throw used tissues in a lined trash can, and immediately wash your hands with soap and water for at least 20 seconds or use an alcohol-based hand rub.  Wash your [employer] your hands often and thoroughly with soap and water for at least 20 seconds. You can use an alcohol-based hand sanitizer if soap and water are not available and if your hands are not visibly dirty. Avoid touching your eyes, nose, and mouth with unwashed hands.   Prevention Steps for Caregivers and Household Members of Individuals Confirmed to have, or Being Evaluated for, COVID-19 Infection Being Cared for in the Home  If you live with, or provide care at home for, a person confirmed to have, or being evaluated for, COVID-19 infection please follow these guidelines to prevent infection:  Follow healthcare provider's instructions Make sure that you understand and can help the patient follow any healthcare provider instructions for all care.  Provide for the patient's basic needs You should help the patient with basic needs in the home and provide support for getting groceries, prescriptions, and other personal needs.  Monitor the patient's symptoms If they are getting sicker, call his or her medical provider and tell them that the patient has, or is being evaluated for, COVID-19 infection. This will help the healthcare provider's  office take steps to keep other people from getting infected. Ask the healthcare provider to call the local or state health department.  Limit the number of people who have contact with the patient  If possible, have only one caregiver for the patient.  Other  household members should stay in another home or place of residence. If this is not possible, they should stay  in another room, or be separated from the patient as much as possible. Use a separate bathroom, if available.  Restrict visitors who do not have an essential need to be in the home.  Keep older adults, very young children, and other sick people away from the patient Keep older adults, very young children, and those who have compromised immune systems or chronic health conditions away from the patient. This includes people with chronic heart, lung, or kidney conditions, diabetes, and cancer.  Ensure good ventilation Make sure that shared spaces in the home have good air flow, such as from an air conditioner or an opened window, weather permitting.  Wash your hands often  Wash your hands often and thoroughly with soap and water for at least 20 seconds. You can use an alcohol based hand sanitizer if soap and water are not available and if your hands are not visibly dirty.  Avoid touching your eyes, nose, and mouth with unwashed hands.  Use disposable paper towels to dry your hands. If not available, use dedicated cloth towels and replace them when they become wet.  Wear a facemask and gloves  Wear a disposable facemask at all times in the room and gloves when you touch or have contact with the patient's blood, body fluids, and/or secretions or excretions, such as sweat, saliva, sputum, nasal mucus, vomit, urine, or feces.  Ensure the mask fits over your nose and mouth tightly, and do not touch it during use.  Throw out disposable facemasks and gloves after using them. Do not reuse.  Wash your hands immediately after removing your facemask and gloves.  If your personal clothing becomes contaminated, carefully remove clothing and launder. Wash your hands after handling contaminated clothing.  Place all used disposable facemasks, gloves, and other waste in a lined container before  disposing them with other household waste.  Remove gloves and wash your hands immediately after handling these items.  Do not share dishes, glasses, or other household items with the patient  Avoid sharing household items. You should not share dishes, drinking glasses, cups, eating utensils, towels, bedding, or other items with a patient who is confirmed to have, or being evaluated for, COVID-19 infection.  After the person uses these items, you should wash them thoroughly with soap and water.  Wash laundry thoroughly  Immediately remove and wash clothes or bedding that have blood, body fluids, and/or secretions or excretions, such as sweat, saliva, sputum, nasal mucus, vomit, urine, or feces, on them.  Wear gloves when handling laundry from the patient.  Read and follow directions on labels of laundry or clothing items and detergent. In general, wash and dry with the warmest temperatures recommended on the label.  Clean all areas the individual has used often  Clean all touchable surfaces, such as counters, tabletops, doorknobs, bathroom fixtures, toilets, phones, keyboards, tablets, and bedside tables, every day. Also, clean any surfaces that may have blood, body fluids, and/or secretions or excretions on them.  Wear gloves when cleaning surfaces the patient has come in contact with.  Use a diluted bleach solution (e.g., dilute bleach with 1  part bleach and 10 parts water) or a household disinfectant with a label that says EPA-registered for coronaviruses. To make a bleach solution at home, add 1 tablespoon of bleach to 1 quart (4 cups) of water. For a larger supply, add  cup of bleach to 1 gallon (16 cups) of water.  Read labels of cleaning products and follow recommendations provided on product labels. Labels contain instructions for safe and effective use of the cleaning product including precautions you should take when applying the product, such as wearing gloves or eye protection  and making sure you have good ventilation during use of the product.  Remove gloves and wash hands immediately after cleaning.  Monitor yourself for signs and symptoms of illness Caregivers and household members are considered close contacts, should monitor their health, and will be asked to limit movement outside of the home to the extent possible. Follow the monitoring steps for close contacts listed on the symptom monitoring form.   ? If you have additional questions, contact your local health department or call the epidemiologist on call at [phone] (available 24/7). ? This guidance is subject to change. For the most up-to-date guidance from CDC, please refer to their website: http://www.martin.com/https://www.cdc.gov/coronavirus/2019-ncov/hcp/guidance-prevent-spread.htmlCOVID-19: How to Protect Yourself and Others Know how it spreads  There is currently no vaccine to prevent coronavirus disease 2019 (COVID-19).  The best way to prevent illness is to avoid being exposed to this virus.  The virus is thought to spread mainly from person-to-person. ? Between people who are in close contact with one another (within about 6 feet). ? Through respiratory droplets produced when an infected person coughs, sneezes or talks. ? These droplets can land in the mouths or noses of people who are nearby or possibly be inhaled into the lungs. ? COVID-19 may be spread by people who are not showing symptoms. Everyone should Clean your hands often  Wash your hands often with soap and water for at least 20 seconds especially after you have been in a public place, or after blowing your nose, coughing, or sneezing.  If soap and water are not readily available, use a hand sanitizer that contains at least 60% alcohol. Cover all surfaces of your hands and rub them together until they feel dry.  Avoid touching your eyes, nose, and mouth with unwashed hands. Avoid close contact  Limit contact with others as much as  possible.  Avoid close contact with people who are sick.  Put distance between yourself and other people. ? Remember that some people without symptoms may be able to spread virus. ? This is especially important for people who are at higher risk of getting very [employer]ro[employer].itsick.www.cdc.gov/coronavirus/2019-ncov/need-extra-precautions/people-at-higher-risk.html Cover your mouth and nose with a mask when around others  You could spread COVID-19 to others even if you do not feel sick.  Everyone should wear a mask in public settings and when around people not living in their household, especially when social distancing is difficult to maintain. ? Masks should not be placed on young children under age 53, anyone who has trouble breathing, or is unconscious, incapacitated or otherwise unable to remove the mask without assistance.  The mask is meant to protect other people in case you are infected.  Do NOT use a facemask meant for a Research scientist (physical sciences)healthcare worker.  Continue to keep about 6 feet between yourself and others. The mask is not a substitute for social distancing. Cover coughs and sneezes  Always cover your mouth and nose with a tissue when you cough  or sneeze or use the inside of your elbow.  Throw used tissues in the trash.  Immediately wash your hands with soap and water for at least 20 seconds. If soap and water are not readily available, clean your hands with a hand sanitizer that contains at least 60% alcohol. Clean and disinfect  Clean AND disinfect frequently touched surfaces daily. This includes tables, doorknobs, light switches, countertops, handles, desks, phones, keyboards, toilets, faucets, and sinks. [email or web address]  If surfaces are dirty, clean them: Use detergent or soap and water prior to disinfection.  Then, use a household disinfectant. You can see a list of EPA-registered household disinfectants  here. [email or web address] 02/25/2019 This information is not intended to replace advice given to you by your health care provider. Make sure you discuss any questions you have with your health care provider. Document Revised: 03/05/2019 Document Reviewed: 01/01/2019 Elsevier Patient Education  2020 Elsevier Inc. COVID-19: Quarantine vs. Isolation QUARANTINE keeps someone who was in close contact with someone who has COVID-19 away from others. If you had close contact with a person who has COVID-19  Stay home until 14 days after your last contact.  Check your temperature twice a day and watch for symptoms of COVID-19.  If possible, stay away from people who are at higher-risk for getting very sick from COVID-19. ISOLATION keeps someone who is sick or tested positive for COVID-19 without symptoms away from others, even in their own home. If you are sick and think or know you have COVID-19  Stay home until after ? At least 10 days since symptoms first appeared and ? At least 24 hours with no fever without fever-reducing medication and ? Symptoms have improved If you tested positive for COVID-19 but do not have symptoms  Stay home until after ? 10 days have passed since your positive test If you live with others, stay in a specific "sick room" or area and away from other people or animals, including pets. Use a separate bathroom, if available. [email or web address] 01/12/2019 This information is not intended to replace advice given to you by your health care provider. Make sure you discuss any questions you have with your health care provider. Document Revised: 05/28/2019 Document Reviewed: 05/28/2019 Elsevier Patient Education  2020 Elsevier Inc. COVID-19 COVID-19 is a respiratory infection that is caused by a virus called severe acute respiratory syndrome coronavirus 2 (SARS-CoV-2). The disease is also known as coronavirus disease or novel coronavirus. In some people, the virus may not  cause any symptoms. In others, it may cause a serious infection. The infection can get worse quickly and can lead to complications, such as:  Pneumonia, or infection of the lungs.  Acute respiratory distress syndrome or ARDS. This is a condition in which fluid build-up in the lungs prevents the lungs from filling with air and passing oxygen into the blood.  Acute respiratory failure. This is a condition in which there is not enough oxygen passing from the lungs to the body or when carbon dioxide is not passing from the lungs out of the body.  Sepsis or septic shock. This is a serious bodily reaction to an infection.  Blood clotting problems.  Secondary infections due to bacteria or fungus.  Organ failure. This is when your body's organs stop working. The virus that causes COVID-19 is contagious. This means that it can spread from person to person through droplets from coughs and sneezes (respiratory secretions). What are the causes? This illness is caused by a virus. You may catch the  virus by:  Breathing in droplets from an infected person. Droplets can be spread by a person breathing, speaking, singing, coughing, or sneezing.  Touching something, like a table or a doorknob, that was exposed to the virus (contaminated) and then touching your mouth, nose, or eyes. What increases the risk? Risk for infection You are more likely to be infected with this virus if you:  Are within 6 feet (2 meters) of a person with COVID-19.  Provide care for or live with a person who is infected with COVID-19.  Spend time in crowded indoor spaces or live in shared housing. Risk for serious illness You are more likely to become seriously ill from the virus if you:  Are 41 years of age or older. The higher your age, the more you are at risk for serious illness.  Live in a nursing home or long-term care facility.  Have cancer.  Have a long-term (chronic) disease such as: ? Chronic lung disease,  including chronic obstructive pulmonary disease or asthma. ? A long-term disease that lowers your body's ability to fight infection (immunocompromised). ? Heart disease, including heart failure, a condition in which the arteries that lead to the heart become narrow or blocked (coronary artery disease), a disease which makes the heart muscle thick, weak, or stiff (cardiomyopathy). ? Diabetes. ? Chronic kidney disease. ? Sickle cell disease, a condition in which red blood cells have an abnormal "sickle" shape. ? Liver disease.  Are obese. What are the signs or symptoms? Symptoms of this condition can range from mild to severe. Symptoms may appear any time from 2 to 14 days after being exposed to the virus. They include:  A fever or chills.  A cough.  Difficulty breathing.  Headaches, body aches, or muscle aches.  Runny or stuffy (congested) nose.  A sore throat.  New loss of taste or smell. Some people may also have stomach problems, such as nausea, vomiting, or diarrhea. Other people may not have any symptoms of COVID-19. How is this diagnosed? This condition may be diagnosed based on:  Your signs and symptoms, especially if: ? You live in an area with a COVID-19 outbreak. ? You recently traveled to or from an area where the virus is common. ? You provide care for or live with a person who was diagnosed with COVID-19. ? You were exposed to a person who was diagnosed with COVID-19.  A physical exam.  Lab tests, which may include: ? Taking a sample of fluid from the back of your nose and throat (nasopharyngeal fluid), your nose, or your throat using a swab. ? A sample of mucus from your lungs (sputum). ? Blood tests.  Imaging tests, which may include, X-rays, CT scan, or ultrasound. How is this treated? At present, there is no medicine to treat COVID-19. Medicines that treat other diseases are being used on a trial basis to see if they are effective against COVID-19. Your  health care provider will talk with you about ways to treat your symptoms. For most people, the infection is mild and can be managed at home with rest, fluids, and over-the-counter medicines. Treatment for a serious infection usually takes places in a hospital intensive care unit (ICU). It may include one or more of the following treatments. These treatments are given until your symptoms improve.  Receiving fluids and medicines through an IV.  Supplemental oxygen. Extra oxygen is given through a tube in the nose, a face mask, or a hood.  Positioning you to  lie on your stomach (prone position). This makes it easier for oxygen to get into the lungs.  Continuous positive airway pressure (CPAP) or bi-level positive airway pressure (BPAP) machine. This treatment uses mild air pressure to keep the airways open. A tube that is connected to a motor delivers oxygen to the body.  Ventilator. This treatment moves air into and out of the lungs by using a tube that is placed in your windpipe.  Tracheostomy. This is a procedure to create a hole in the neck so that a breathing tube can be inserted.  Extracorporeal membrane oxygenation (ECMO). This procedure gives the lungs a chance to recover by taking over the functions of the heart and lungs. It supplies oxygen to the body and removes carbon dioxide. Follow these instructions at home: Lifestyle  If you are sick, stay home except to get medical care. Your health care provider will tell you how long to stay home. Call your health care provider before you go for medical care.  Rest at home as told by your health care provider.  Do not use any products that contain nicotine or tobacco, such as cigarettes, e-cigarettes, and chewing tobacco. If you need help quitting, ask your health care provider.  Return to your normal activities as told by your health care provider. Ask your health care provider what activities are safe for you. General  instructions  Take over-the-counter and prescription medicines only as told by your health care provider.  Drink enough fluid to keep your urine pale yellow.  Keep all follow-up visits as told by your health care provider. This is important. How is this prevented?  There is no vaccine to help prevent COVID-19 infection. However, there are steps you can take to protect yourself and others from this virus. To protect yourself:   Do not travel to areas where COVID-19 is a risk. The areas where COVID-19 is reported change often. To identify high-risk areas and travel restrictions, check the CDC travel website: StageSync.si  If you live in, or must travel to, an area where COVID-19 is a risk, take precautions to avoid infection. ? Stay away from people who are sick. ? Wash your hands often with soap and water for 20 seconds. If soap and water are not available, use an alcohol-based hand sanitizer. ? Avoid touching your mouth, face, eyes, or nose. ? Avoid going out in public, follow guidance from your state and local health authorities. ? If you must go out in public, wear a cloth face covering or face mask. Make sure your mask covers your nose and mouth. ? Avoid crowded indoor spaces. Stay at least 6 feet (2 meters) away from others. ? Disinfect objects and surfaces that are frequently touched every day. This may include:  Counters and tables.  Doorknobs and light switches.  Sinks and faucets.  Electronics, such as phones, remote controls, keyboards, computers, and tablets. To protect others: If you have symptoms of COVID-19, take steps to prevent the virus from spreading to others.  If you think you have a COVID-19 infection, contact your health care provider right away. Tell your health care team that you think you may have a COVID-19 infection.  Stay home. Leave your house only to seek medical care. Do not use public transport.  Do not travel while you are  sick.  Wash your hands often with soap and water for 20 seconds. If soap and water are not available, use alcohol-based hand sanitizer.  Stay away from other members  of your household. Let healthy household members care for children and pets, if possible. If you have to care for children or pets, wash your hands often and wear a mask. If possible, stay in your own room, separate from others. Use a different bathroom.  Make sure that all people in your household wash their hands well and often.  Cough or sneeze into a tissue or your sleeve or elbow. Do not cough or sneeze into your hand or into the air.  Wear a cloth face covering or face mask. Make sure your mask covers your nose and mouth. Where to find more information  Centers for Disease Control and Prevention: StickerEmporium.tn  World Health Organization: https://thompson-craig.com/ Contact a health care provider if:  You live in or have traveled to an area where COVID-19 is a risk and you have symptoms of the infection.  You have had contact with someone who has COVID-19 and you have symptoms of the infection. Get help right away if:  You have trouble breathing.  You have pain or pressure in your chest.  You have confusion.  You have bluish lips and fingernails.  You have difficulty waking from sleep.  You have symptoms that get worse. These symptoms may represent a serious problem that is an emergency. Do not wait to see if the symptoms will go away. Get medical help right away. Call your local emergency services (911 in the [place] Do not drive yourself to the hospital. Let the emergency medical personnel know if you think you have COVID-19. Summary  COVID-19 is a respiratory infection that is caused by a virus. It is also known as coronavirus disease or novel coronavirus. It can cause serious infections, such as pneumonia, acute respiratory distress syndrome, acute respiratory failure,  or sepsis.  The virus that causes COVID-19 is contagious. This means that it can spread from person to person through droplets from breathing, speaking, singing, coughing, or sneezing.  You are more likely to develop a serious illness if you are 69 years of age or older, have a weak immune system, live in a nursing home, or have chronic disease.  There is no medicine to treat COVID-19. Your health care provider will talk with you about ways to treat your symptoms.  Take steps to protect yourself and others from infection. Wash your hands often and disinfect objects and surfaces that are frequently touched every day. Stay away from people who are sick and wear a mask if you are sick. This information is not intended to replace advice given to you by your health care provider. Make sure you discuss any questions you have with your health care provider. Document Revised: 04/10/2019 Document Reviewed: 07/17/2018 Elsevier Patient Education  2020 [employer].

## 2019-08-04 LAB — CULTURE, BLOOD (ROUTINE X 2)
Culture: NO GROWTH
Culture: NO GROWTH
Special Requests: ADEQUATE
Special Requests: ADEQUATE

## 2019-08-12 ENCOUNTER — Ambulatory Visit
Admission: RE | Admit: 2019-08-12 | Discharge: 2019-08-12 | Disposition: A | Discharge status: Home / Self Care | Payer: BC Managed Care – PPO | Source: Ambulatory Visit | Attending: Obstetrics & Gynecology | Admitting: Obstetrics & Gynecology

## 2019-08-12 DIAGNOSIS — R928 Other abnormal and inconclusive findings on diagnostic imaging of breast: Secondary | ICD-10-CM | POA: Diagnosis present

## 2019-12-25 ENCOUNTER — Other Ambulatory Visit: Payer: Self-pay | Admitting: Obstetrics & Gynecology

## 2019-12-25 DIAGNOSIS — R921 Mammographic calcification found on diagnostic imaging of breast: Secondary | ICD-10-CM

## 2019-12-25 DIAGNOSIS — R928 Other abnormal and inconclusive findings on diagnostic imaging of breast: Secondary | ICD-10-CM

## 2019-12-25 DIAGNOSIS — Z1231 Encounter for screening mammogram for malignant neoplasm of breast: Secondary | ICD-10-CM

## 2019-12-29 ENCOUNTER — Other Ambulatory Visit: Payer: Self-pay

## 2019-12-30 ENCOUNTER — Other Ambulatory Visit: Payer: Self-pay

## 2019-12-30 DIAGNOSIS — K2100 Gastro-esophageal reflux disease with esophagitis, without bleeding: Secondary | ICD-10-CM

## 2019-12-30 NOTE — Telephone Encounter (Signed)
Last office visit 11/14/16 GERD  Last refill 07/01/2019 0 refills  Patient made appointment  for 02/24/2020

## 2020-01-26 ENCOUNTER — Other Ambulatory Visit: Payer: Self-pay

## 2020-01-26 DIAGNOSIS — K2100 Gastro-esophageal reflux disease with esophagitis, without bleeding: Secondary | ICD-10-CM

## 2020-01-26 MED ORDER — DEXILANT 60 MG PO CPDR: 60.0000 mg | DELAYED_RELEASE_CAPSULE | Freq: Every day | ORAL | 1 refills | Status: DC

## 2020-02-13 ENCOUNTER — Other Ambulatory Visit: Payer: Self-pay | Admitting: Obstetrics & Gynecology

## 2020-02-13 DIAGNOSIS — N3281 Overactive bladder: Secondary | ICD-10-CM

## 2020-02-14 NOTE — Telephone Encounter (Signed)
Sch Annual.  Rx refill til then.

## 2020-02-16 NOTE — Telephone Encounter (Signed)
Called and spoke with patient to schedule. Patient needs to call back to be schedule

## 2020-02-24 ENCOUNTER — Ambulatory Visit: Payer: BC Managed Care – PPO | Admitting: Gastroenterology

## 2020-05-10 ENCOUNTER — Other Ambulatory Visit: Payer: Self-pay

## 2020-05-10 ENCOUNTER — Ambulatory Visit
Admission: RE | Admit: 2020-05-10 | Discharge: 2020-05-10 | Disposition: A | Discharge status: Home / Self Care | Payer: BC Managed Care – PPO | Source: Ambulatory Visit | Attending: Obstetrics & Gynecology | Admitting: Obstetrics & Gynecology

## 2020-05-10 DIAGNOSIS — R921 Mammographic calcification found on diagnostic imaging of breast: Secondary | ICD-10-CM | POA: Diagnosis present

## 2020-05-10 DIAGNOSIS — Z1231 Encounter for screening mammogram for malignant neoplasm of breast: Secondary | ICD-10-CM | POA: Insufficient documentation

## 2020-05-10 DIAGNOSIS — R928 Other abnormal and inconclusive findings on diagnostic imaging of breast: Secondary | ICD-10-CM | POA: Insufficient documentation

## 2020-06-15 ENCOUNTER — Ambulatory Visit (INDEPENDENT_AMBULATORY_CARE_PROVIDER_SITE_OTHER): Payer: BC Managed Care – PPO | Admitting: Obstetrics & Gynecology

## 2020-06-15 ENCOUNTER — Encounter: Payer: Self-pay | Admitting: Obstetrics & Gynecology

## 2020-06-15 ENCOUNTER — Other Ambulatory Visit (HOSPITAL_COMMUNITY)
Admission: RE | Admit: 2020-06-15 | Discharge: 2020-06-15 | Disposition: A | Discharge status: Home / Self Care | Payer: BC Managed Care – PPO | Source: Ambulatory Visit | Attending: Obstetrics & Gynecology | Admitting: Obstetrics & Gynecology

## 2020-06-15 ENCOUNTER — Other Ambulatory Visit: Payer: Self-pay

## 2020-06-15 VITALS — BP 120/80 | Ht 66.0 in | Wt 168.0 lb

## 2020-06-15 DIAGNOSIS — N3281 Overactive bladder: Secondary | ICD-10-CM | POA: Diagnosis not present

## 2020-06-15 DIAGNOSIS — Z01419 Encounter for gynecological examination (general) (routine) without abnormal findings: Secondary | ICD-10-CM | POA: Diagnosis not present

## 2020-06-15 DIAGNOSIS — Z124 Encounter for screening for malignant neoplasm of cervix: Secondary | ICD-10-CM | POA: Insufficient documentation

## 2020-06-15 DIAGNOSIS — Z1211 Encounter for screening for malignant neoplasm of colon: Secondary | ICD-10-CM

## 2020-06-15 MED ORDER — MIRABEGRON ER 25 MG PO TB24: ORAL_TABLET | ORAL | 3 refills | Status: DC

## 2020-06-15 NOTE — Patient Instructions (Signed)
PAP every three years Mammogram every year    Call [phone] to schedule at [place] Colonoscopy every 10 years Labs yearly (with PCP)  Thank you for choosing [facility]. As part of our ongoing efforts to improve patient experience, we would appreciate your feedback. Please fill out the short survey that you will receive by mail or MyChart. Your opinion is important to us! - Dr. Leniya Breit  Recommendations to boost your immunity to prevent illness such as viral flu and colds, including covid19, are as follows:       - - -  Vitamin K2 and Vitamin D3  - - - Take Vitamin K2 at 200-300 mcg daily (usually 2-3 pills daily of the over the counter formulation). Take Vitamin D3 at 3000-4000 U daily (usually 3-4 pills daily of the over the counter formulation). Studies show that these two at high normal levels in your system are very effective in keeping your immunity so strong and protective that you will be unlikely to contract viral illness such as those listed above.  Dr Murriel Holwerda  

## 2020-06-15 NOTE — Progress Notes (Signed)
HPI:      [name] is a 54 y.o. [phone] who LMP was in the past, she presents today for her annual examination.  The patient has no complaints today. Improved vag dryness, not needing Replens.  Improved OAB on Myrbetriq, no side effects.  The patient is sexually active. Herlast pap: approximate date 2018 and was normal and last mammogram: approximate date 2021 and was normal.  The patient does perform self breast exams.  There is notable family history of breast or ovarian cancer in her family. The patient is not taking hormone replacement therapy. Patient denies post-menopausal vaginal bleeding.   The patient has regular exercise: yes. The patient denies current symptoms of depression.    GYN Hx: Last Colonoscopy:6 years ago. Normal.  Last DEXA: never ago.    PMHx: Past Medical History:  Diagnosis Date   Depression    Diabetes mellitus without complication (HCC)    Family history of ovarian cancer    Pt is My Risk neg 5/16 except NBN VUS   GERD (gastroesophageal reflux disease)    Hypertension    Iron deficiency    Past Surgical History:  Procedure Laterality Date   BREAST BIOPSY Right 2014   calcs with Sankar, benign, with clip   BREAST SURGERY Right    biopsy; Dr. Evette Cristal   CERVICAL DISCECTOMY  03/15/2010   CESAREAN SECTION  1991   COLONOSCOPY     LASIK     VAGINAL BIRTH AFTER CESAREAN SECTION  1993 and 1997   Family History  Problem Relation Age of Onset   Lung cancer Mother    COPD Father    Hypertension Father    Allergies Father    Coronary artery disease Father    Healthy Sister    Heart attack Paternal Grandmother    Ovarian cancer Paternal Grandmother    Heart attack Paternal Grandfather    Breast cancer Neg Hx    Social History   Tobacco Use   Smoking status: Never Smoker   Smokeless tobacco: Never Used  Vaping Use   Vaping Use: Never used  Substance Use Topics   Alcohol use: No   Drug use: No    Current  Outpatient Medications:    atorvastatin (LIPITOR) 40 MG tablet, Take 40 mg by mouth daily., Disp: , Rfl:    cetirizine (ZYRTEC) 10 MG tablet, Take 10 mg by mouth daily., Disp: , Rfl:    dexlansoprazole (DEXILANT) 60 MG capsule, Take 1 capsule (60 mg total) by mouth daily., Disp: 90 capsule, Rfl: 1   FEROSUL 325 (65 Fe) MG tablet, Take 1 tablet (325 mg total) by mouth daily with breakfast. (Patient taking differently: Take 325 mg by mouth daily with breakfast.), Disp: 30 tablet, Rfl: 3   glipiZIDE (GLUCOTROL XL) 5 MG 24 hr tablet, Take 1 tablet (5 mg total) by mouth daily with breakfast., Disp: 30 tablet, Rfl: 0   hydrochlorothiazide (HYDRODIURIL) 12.5 MG tablet, Take 12.5 mg by mouth daily., Disp: , Rfl:    JANUVIA 100 MG tablet, TAKE 1 TABLET BY MOUTH EVERY DAY (Patient taking differently: Take 100 mg by mouth daily.), Disp: 90 tablet, Rfl: 1   lisinopril (ZESTRIL) 10 MG tablet, Take 10 mg by mouth daily., Disp: , Rfl:    metFORMIN (GLUCOPHAGE-XR) 500 MG 24 hr tablet, Take 1,000 mg by mouth daily with breakfast. , Disp: , Rfl:    ONETOUCH DELICA LANCETS FINE MISC, To check blood sugar once a day DX:  E11.9, Disp:  100 each, Rfl: 5   desvenlafaxine (PRISTIQ) 50 MG 24 hr tablet, Take 50 mg by mouth daily. , Disp: , Rfl:    glucose blood (ONETOUCH VERIO) test strip, To check blood sugar once daily, Disp: 100 each, Rfl: 5   mirabegron ER (MYRBETRIQ) 25 MG TB24 tablet, TAKE 1 TABLET(25 MG) BY MOUTH DAILY, Disp: 90 tablet, Rfl: 3 Allergies: Patient has no known allergies.  Review of Systems  Constitutional: Negative for chills, fever and malaise/fatigue.  HENT: Negative for congestion, sinus pain and sore throat.   Eyes: Negative for blurred vision and pain.  Respiratory: Negative for cough and wheezing.   Cardiovascular: Negative for chest pain and leg swelling.  Gastrointestinal: Negative for abdominal pain, constipation, diarrhea, heartburn, nausea and vomiting.  Genitourinary:  Negative for dysuria, frequency, hematuria and urgency.  Musculoskeletal: Negative for back pain, joint pain, myalgias and neck pain.  Skin: Negative for itching and rash.  Neurological: Negative for dizziness, tremors and weakness.  Endo/Heme/Allergies: Does not bruise/bleed easily.  Psychiatric/Behavioral: Negative for depression. The patient is not nervous/anxious and does not have insomnia.     Objective: BP 120/80    Ht 5\' 6"  (1.676 m)    Wt 168 lb (76.2 kg)    LMP 07/18/2010    BMI 27.12 kg/m   Filed Weights   06/15/20 1520  Weight: 168 lb (76.2 kg)   Body mass index is 27.12 kg/m. Physical Exam Constitutional:      General: She is not in acute distress.    Appearance: She is well-developed and well-nourished.  Genitourinary:     Vagina, uterus and rectum normal.     There is no rash or lesion on the right labia.     There is no rash or lesion on the left labia.    No lesions in the vagina.     No vaginal bleeding.      Right Adnexa: not tender and no mass present.    Left Adnexa: not tender and no mass present.    No cervical motion tenderness, friability, lesion or polyp.     Uterus is mobile.     Uterus is not enlarged.     No uterine mass detected.    Uterus is midaxial.     Pelvic exam was performed with patient supine.  Breasts:     Right: No mass, skin change or tenderness.     Left: No mass, skin change or tenderness.    HENT:     Head: Normocephalic and atraumatic. No laceration.     Right Ear: Hearing normal.     Left Ear: Hearing normal.     Nose: No epistaxis or foreign body.     Mouth/Throat:     Mouth: Oropharynx is clear and moist and mucous membranes are normal.     Pharynx: Uvula midline.  Eyes:     Pupils: Pupils are equal, round, and reactive to light.  Neck:     Thyroid: No thyromegaly.  Cardiovascular:     Rate and Rhythm: Normal rate and regular rhythm.     Heart sounds: No murmur heard. No friction rub. No gallop.   Pulmonary:      Effort: Pulmonary effort is normal. No respiratory distress.     Breath sounds: Normal breath sounds. No wheezing.  Abdominal:     General: Bowel sounds are normal. There is no distension.     Palpations: Abdomen is soft.     Tenderness: There is no abdominal tenderness.  There is no rebound.  Musculoskeletal:        General: Normal range of motion.     Cervical back: Normal range of motion and neck supple.  Neurological:     Mental Status: She is alert and oriented to person, place, and time.     Cranial Nerves: No cranial nerve deficit.  Skin:    General: Skin is warm and dry.  Psychiatric:        Mood and Affect: Mood and affect normal.        Judgment: Judgment normal.  Vitals reviewed.     Assessment: Annual Exam 1. Women's annual routine gynecological examination   2. Screen for colon cancer   3. Screening for cervical cancer   4. Overactive bladder     Plan:            1.  Cervical Screening-  Pap smear done today  2. Breast screening- Exam annually and mammogram scheduled  3. Colonoscopy every 10 years, Hemoccult testing after age 29  4. Labs managed by PCP  5. Counseling for hormonal therapy: none              6. FRAX - FRAX score for assessing the 10 year probability for fracture calculated and discussed today.  Based on age and score today, DEXA is not currently scheduled.   7. OAB, cont Myrbetriq    F/U  Return in about 1 year (around 06/15/2021) for Annual.  [name], MD, [facility], [facility] 06/15/2020  3:45 PM

## 2020-06-21 LAB — CYTOLOGY - PAP
Adequacy: ABSENT
Comment: NEGATIVE
Diagnosis: UNDETERMINED — AB
High risk HPV: POSITIVE — AB

## 2020-06-22 ENCOUNTER — Telehealth: Payer: Self-pay | Admitting: Obstetrics & Gynecology

## 2020-06-22 NOTE — Telephone Encounter (Signed)
-----   Message from Nadara Mustard, MD sent at 06/22/2020  7:59 AM EST ----- Sch COLPO  D/w pt, ASCUS PAP in need of follow up

## 2020-06-22 NOTE — Progress Notes (Signed)
Sch COLPO  D/w pt, ASCUS PAP in need of follow up

## 2020-06-22 NOTE — Telephone Encounter (Signed)
Patient is scheduled for Colpo on 07/08/20 with RPh

## 2020-07-08 ENCOUNTER — Other Ambulatory Visit: Payer: Self-pay

## 2020-07-08 ENCOUNTER — Encounter: Payer: Self-pay | Admitting: Obstetrics & Gynecology

## 2020-07-08 ENCOUNTER — Ambulatory Visit (INDEPENDENT_AMBULATORY_CARE_PROVIDER_SITE_OTHER): Payer: BC Managed Care – PPO | Admitting: Obstetrics & Gynecology

## 2020-07-08 ENCOUNTER — Other Ambulatory Visit (HOSPITAL_COMMUNITY)
Admission: RE | Admit: 2020-07-08 | Discharge: 2020-07-08 | Disposition: A | Discharge status: Home / Self Care | Payer: BC Managed Care – PPO | Source: Ambulatory Visit | Attending: Obstetrics & Gynecology | Admitting: Obstetrics & Gynecology

## 2020-07-08 VITALS — BP 110/60 | Ht 66.0 in | Wt 168.0 lb

## 2020-07-08 DIAGNOSIS — R87810 Cervical high risk human papillomavirus (HPV) DNA test positive: Secondary | ICD-10-CM | POA: Diagnosis not present

## 2020-07-08 DIAGNOSIS — R87610 Atypical squamous cells of undetermined significance on cytologic smear of cervix (ASC-US): Secondary | ICD-10-CM | POA: Insufficient documentation

## 2020-07-08 NOTE — Patient Instructions (Signed)
[email or web address]  Colposcopy, Care After This sheet gives you information about how to care for yourself after your procedure. Your health care provider may also give you more specific instructions. If you have problems or questions, contact your health care provider. What can I expect after the procedure? If you had a colposcopy without a biopsy, you can expect to feel fine right away after your procedure. However, you may have some spotting of blood for a few days. You can return to your normal activities. If you had a colposcopy with a biopsy, it is common after the procedure to have:  Soreness and mild pain. These may last for a few days.  Light-headedness.  Mild vaginal bleeding or discharge that is dark-colored and grainy. This may last for a few days. The discharge may be caused by a liquid (solution) that was used during the procedure. You may need to wear a sanitary pad during this time.  Spotting of blood for at least 48 hours after the procedure. Follow these instructions at home: Medicines  Take over-the-counter and prescription medicines only as told by your health care provider.  Talk with your health care provider about what type of over-the-counter pain medicine and prescription medicine you can start to take again. It is especially important to talk with your health care provider if you take blood thinners. Activity  Limit your physical activity for the first day after your procedure as told by your health care provider.  Avoid using douche products, using tampons, or having sex for at least 3 days after the procedure or for as long as told.  Return to your normal activities as told by your health care provider. Ask your health care provider what activities are safe for you. General instructions  Drink enough fluid to keep your urine pale yellow.  Ask your health care provider if you may take baths, swim, or use a hot tub. You may take  showers.  If you use birth control (contraception), continue to use it.  Keep all follow-up visits as told by your health care provider. This is important.   Contact a health care provider if:  You develop a skin rash. Get help right away if:  You bleed a lot from your vagina or pass blood clots. This includes using more than one sanitary pad each hour for 2 hours in a row.  You have a fever or chills.  You have vaginal discharge that is abnormal, is yellow in color, or smells bad. This could be a sign of infection.  You have severe pain or cramps in your lower abdomen that do not go away with medicine.  You faint. Summary  If you had a colposcopy without a biopsy, you can expect to feel fine right away, but you may have some spotting of blood for a few days. You can return to your normal activities.  If you had a colposcopy with a biopsy, it is common to have mild pain for a few days and spotting for 48 hours after the procedure.  Avoid using douche products, using tampons, and having sex for at least 3 days after the procedure or for as long as told by your health care provider.  Get help right away if you have heavy bleeding, severe pain, or signs of infection. This information is not intended to replace advice given to you by your health care provider. Make sure you discuss any questions you have with your health care provider. Document Revised: 06/10/2019 Document Reviewed:   06/10/2019 Elsevier Patient Education  2021 Elsevier Inc.  

## 2020-07-08 NOTE — Progress Notes (Signed)
HPI:  Misty Graham is a 55 y.o.  [phone]  who presents today for evaluation and management of abnormal cervical cytology.    Dysplasia History:  ASCUS, HPV pos  ROS:  Pertinent items are noted in HPI.  OB History  Gravida Para Term Preterm AB Living  3 3 3     3   SAB IAB Ectopic Multiple Live Births               # Outcome Date GA Lbr Len/2nd Weight Sex Delivery Anes PTL Lv  3 Term 03/06/96   8 lb 1.6 oz (3.674 kg) M VBAC     2 Term 02/12/92   7 lb 3.2 oz (3.266 kg) M VBAC     1 Term 08/06/89   8 lb 3.2 oz (3.719 kg) M CS-Unspec       Past Medical History:  Diagnosis Date  . Depression   . Diabetes mellitus without complication (HCC)   . Family history of ovarian cancer    Pt is My Risk neg 5/16 except NBN VUS  . GERD (gastroesophageal reflux disease)   . Hypertension   . Iron deficiency     Past Surgical History:  Procedure Laterality Date  . BREAST BIOPSY Right 2014   calcs with Sankar, benign, with clip  . BREAST SURGERY Right    biopsy; Dr. 6/16  . CERVICAL DISCECTOMY  03/15/2010  . CESAREAN SECTION  1991  . COLONOSCOPY    . LASIK    . VAGINAL BIRTH AFTER CESAREAN SECTION  1993 and 1997    SOCIAL HISTORY: Social History   Substance and Sexual Activity  Alcohol Use No   Social History   Substance and Sexual Activity  Drug Use No     Family History  Problem Relation Age of Onset  . Lung cancer Mother   . COPD Father   . Hypertension Father   . Allergies Father   . Coronary artery disease Father   . Healthy Sister   . Heart attack Paternal Grandmother   . Ovarian cancer Paternal Grandmother   . Heart attack Paternal Grandfather   . Breast cancer Neg Hx     ALLERGIES:  Patient has no known allergies.  Current Outpatient Medications on File Prior to Visit  Medication Sig Dispense Refill  . atorvastatin (LIPITOR) 40 MG tablet Take 40 mg by mouth daily.    . cetirizine (ZYRTEC) 10 MG tablet Take 10 mg by mouth daily.    1998 dexlansoprazole  (DEXILANT) 60 MG capsule Take 1 capsule (60 mg total) by mouth daily. 90 capsule 1  . FEROSUL 325 (65 Fe) MG tablet Take 1 tablet (325 mg total) by mouth daily with breakfast. (Patient taking differently: Take 325 mg by mouth daily with breakfast.) 30 tablet 3  . glipiZIDE (GLUCOTROL XL) 5 MG 24 hr tablet Take 1 tablet (5 mg total) by mouth daily with breakfast. 30 tablet 0  . glucose blood (ONETOUCH VERIO) test strip To check blood sugar once daily 100 each 5  . hydrochlorothiazide (HYDRODIURIL) 12.5 MG tablet Take 12.5 mg by mouth daily.    Marland Kitchen JANUVIA 100 MG tablet TAKE 1 TABLET BY MOUTH EVERY DAY (Patient taking differently: Take 100 mg by mouth daily.) 90 tablet 1  . lisinopril (ZESTRIL) 10 MG tablet Take 10 mg by mouth daily.    . metFORMIN (GLUCOPHAGE-XR) 500 MG 24 hr tablet Take 1,000 mg by mouth daily with breakfast.     . mirabegron ER (MYRBETRIQ)  25 MG TB24 tablet TAKE 1 TABLET(25 MG) BY MOUTH DAILY 90 tablet 3  . ONETOUCH DELICA LANCETS FINE MISC To check blood sugar once a day DX:  E11.9 100 each 5  . desvenlafaxine (PRISTIQ) 50 MG 24 hr tablet Take 50 mg by mouth daily.      No current facility-administered medications on file prior to visit.    Physical Exam: -Vitals:  BP 110/60   Ht 5\' 6"  (1.676 m)   Wt 168 lb (76.2 kg)   LMP 07/18/2010   BMI 27.12 kg/m  GEN: WD, WN, NAD.  A+ O x 3, good mood and affect. ABD:  NT, ND.  Soft, no masses.  No hernias noted.   Pelvic:   Vulva: Normal appearance.  No lesions.  Vagina: No lesions or abnormalities noted.  Support: Normal pelvic support.  Urethra No masses tenderness or scarring.  Meatus Normal size without lesions or prolapse.  Cervix: See below.  Anus: Normal exam.  No lesions.  Perineum: Normal exam.  No lesions.        Bimanual   Uterus: Normal size.  Non-tender.  Mobile.  AV.  Adnexae: No masses.  Non-tender to palpation.  Cul-de-sac: Negative for abnormality.   PROCEDURE: 1.  Urine Pregnancy Test:  not done 2.   Colposcopy performed with 4% acetic acid after verbal consent obtained                                         -Aceto-white Lesions Location(s): none.              -Biopsy performed at 7, 12 o'clock               -ECC indicated and performed: Yes.       -Biopsy sites made hemostatic with pressure, AgNO3, and/or Monsel's solution   -Satisfactory colposcopy: Yes.      -Evidence of Invasive cervical CA :  NO  ASSESSMENT:  Misty Graham is a 55 y.o. [id number] here for  1. ASCUS with positive high risk HPV cervical    PLAN: 1.  I discussed the grading system of pap smears and HPV high risk viral types.  We will discuss and base management after colpo results return. 2. Follow up PAP 6 months, vs intervention if high grade dysplasia identified      52, MD, [name] Ob/Gyn, [facility] 07/08/2020  9:26 AM

## 2020-07-11 LAB — SURGICAL PATHOLOGY

## 2020-07-13 ENCOUNTER — Other Ambulatory Visit: Payer: Self-pay

## 2020-07-14 ENCOUNTER — Encounter: Payer: Self-pay | Admitting: Gastroenterology

## 2020-07-14 ENCOUNTER — Other Ambulatory Visit: Payer: Self-pay

## 2020-07-14 ENCOUNTER — Ambulatory Visit: Payer: BC Managed Care – PPO | Admitting: Gastroenterology

## 2020-07-14 VITALS — BP 119/62 | HR 109 | Ht 66.0 in | Wt 165.4 lb

## 2020-07-14 DIAGNOSIS — K219 Gastro-esophageal reflux disease without esophagitis: Secondary | ICD-10-CM | POA: Diagnosis not present

## 2020-07-14 DIAGNOSIS — R12 Heartburn: Secondary | ICD-10-CM | POA: Diagnosis not present

## 2020-07-14 DIAGNOSIS — K2100 Gastro-esophageal reflux disease with esophagitis, without bleeding: Secondary | ICD-10-CM

## 2020-07-14 MED ORDER — DEXLANSOPRAZOLE 60 MG PO CPDR: 60.0000 mg | DELAYED_RELEASE_CAPSULE | Freq: Every day | ORAL | 3 refills | Status: DC

## 2020-07-14 NOTE — Progress Notes (Signed)
Primary Care Physician: Nadara Mustard, MD  Primary Gastroenterologist:  Dr. Midge Minium  Chief Complaint  Patient presents with  . Medication Refill    HPI: Misty Graham is a 55 y.o. female here for follow-up of her heartburn and refill of her Dexilant. The patient reports that she had mistakenly stopped her Dexilant and her heartburn came back with significant symptoms.  She is doing fine without any acid breakthrough while she is on the medication.  She denies any black stools or bloody stools.  There is no report of any change in bowel habits. She also denies any abdominal pain.  There has been no dysphagia or early satiety noted.  Past Medical History:  Diagnosis Date  . Depression   . Diabetes mellitus without complication (HCC)   . Family history of ovarian cancer    Pt is My Risk neg 5/16 except NBN VUS  . GERD (gastroesophageal reflux disease)   . Hypertension   . Iron deficiency     Current Outpatient Medications  Medication Sig Dispense Refill  . atorvastatin (LIPITOR) 40 MG tablet Take 40 mg by mouth daily.    . cetirizine (ZYRTEC) 10 MG tablet Take 10 mg by mouth daily.    . Continuous Blood Gluc Sensor (FREESTYLE LIBRE 14 DAY SENSOR) MISC Apply topically.    . FEROSUL 325 (65 Fe) MG tablet Take 1 tablet (325 mg total) by mouth daily with breakfast. (Patient taking differently: Take 325 mg by mouth daily with breakfast.) 30 tablet 3  . glipiZIDE (GLUCOTROL XL) 5 MG 24 hr tablet Take 1 tablet (5 mg total) by mouth daily with breakfast. 30 tablet 0  . glucose blood (ONETOUCH VERIO) test strip To check blood sugar once daily 100 each 5  . hydrochlorothiazide (HYDRODIURIL) 12.5 MG tablet Take 12.5 mg by mouth daily.    . Ipratropium-Albuterol (COMBIVENT RESPIMAT) 20-100 MCG/ACT AERS respimat Inhale into the lungs.    Marland Kitchen JANUVIA 100 MG tablet TAKE 1 TABLET BY MOUTH EVERY DAY (Patient taking differently: Take 100 mg by mouth daily.) 90 tablet 1  . lisinopril (ZESTRIL)  10 MG tablet Take 10 mg by mouth daily.    . metFORMIN (GLUCOPHAGE-XR) 500 MG 24 hr tablet Take 1,000 mg by mouth daily with breakfast.     . mirabegron ER (MYRBETRIQ) 25 MG TB24 tablet TAKE 1 TABLET(25 MG) BY MOUTH DAILY 90 tablet 3  . ONETOUCH DELICA LANCETS FINE MISC To check blood sugar once a day DX:  E11.9 100 each 5  . desvenlafaxine (PRISTIQ) 50 MG 24 hr tablet Take 50 mg by mouth daily.     Marland Kitchen dexlansoprazole (DEXILANT) 60 MG capsule Take 1 capsule (60 mg total) by mouth daily. 90 capsule 3   No current facility-administered medications for this visit.    Allergies as of 07/14/2020  . (No Known Allergies)    ROS:  General: Negative for anorexia, weight loss, fever, chills, fatigue, weakness. ENT: Negative for hoarseness, difficulty swallowing , nasal congestion. CV: Negative for chest pain, angina, palpitations, dyspnea on exertion, peripheral edema.  Respiratory: Negative for dyspnea at rest, dyspnea on exertion, cough, sputum, wheezing.  GI: See history of present illness. GU:  Negative for dysuria, hematuria, urinary incontinence, urinary frequency, nocturnal urination.  Endo: Negative for unusual weight change.    Physical Examination:   BP 119/62   Pulse (!) 109   Ht 5\' 6"  (1.676 m)   Wt 165 lb 6.4 oz (75 kg)   LMP  07/18/2010   BMI 26.70 kg/m   General: Well-nourished, well-developed in no acute distress.  Eyes: No icterus. Conjunctivae pink. Lungs: Clear to auscultation bilaterally. Non-labored. Heart: Regular rate and rhythm, no murmurs rubs or gallops.  Abdomen: Bowel sounds are normal, nontender, nondistended, no hepatosplenomegaly or masses, no abdominal bruits or hernia , no rebound or guarding.   Extremities: No lower extremity edema. No clubbing or deformities. Neuro: Alert and oriented x 3.  Grossly intact. Skin: Warm and dry, no jaundice.   Psych: Alert and cooperative, normal mood and affect.  Labs:    Imaging Studies: No results  found.  Assessment and Plan:   Misty Graham is a 55 y.o. y/o female Who comes in today for a follow-up of heartburn.  The patient has tried to stop the Dexilant by accident and it resulted in her having extreme heartburn.  The patient is doing well on her present medication and has been informed to continue this medication.  She will be given a refill of the medication and to contact us if she has any further issues. The patient has been explained the plan and agrees with it.     Midge Minium, MD. Clementeen Graham    Note: This dictation was prepared with Dragon dictation along with smaller phrase technology. Any transcriptional errors that result from this process are unintentional.

## 2020-08-23 ENCOUNTER — Other Ambulatory Visit: Payer: Self-pay | Admitting: Obstetrics & Gynecology

## 2020-08-23 DIAGNOSIS — N3281 Overactive bladder: Secondary | ICD-10-CM

## 2020-08-25 ENCOUNTER — Other Ambulatory Visit: Payer: Self-pay | Admitting: Obstetrics & Gynecology

## 2020-08-25 DIAGNOSIS — N3281 Overactive bladder: Secondary | ICD-10-CM

## 2020-08-25 MED ORDER — TOLTERODINE TARTRATE ER 4 MG PO CP24: 4.0000 mg | ORAL_CAPSULE | Freq: Every day | ORAL | 3 refills | Status: DC

## 2020-08-25 NOTE — Telephone Encounter (Signed)
Please advise 

## 2020-10-31 ENCOUNTER — Ambulatory Visit
Admission: EM | Admit: 2020-10-31 | Discharge: 2020-10-31 | Disposition: A | Discharge status: Home / Self Care | Payer: BC Managed Care – PPO | Attending: Physician Assistant | Admitting: Physician Assistant

## 2020-10-31 ENCOUNTER — Encounter: Payer: Self-pay | Admitting: Emergency Medicine

## 2020-10-31 ENCOUNTER — Other Ambulatory Visit: Payer: Self-pay

## 2020-10-31 DIAGNOSIS — R300 Dysuria: Secondary | ICD-10-CM | POA: Insufficient documentation

## 2020-10-31 DIAGNOSIS — N3001 Acute cystitis with hematuria: Secondary | ICD-10-CM | POA: Diagnosis present

## 2020-10-31 LAB — URINALYSIS, COMPLETE (UACMP) WITH MICROSCOPIC
Bilirubin Urine: NEGATIVE
Glucose, UA: NEGATIVE mg/dL
Ketones, ur: NEGATIVE mg/dL
Nitrite: NEGATIVE
Protein, ur: NEGATIVE mg/dL
Specific Gravity, Urine: <1.005 — ABNORMAL LOW (ref 1.005–1.030)
pH: 6 (ref 5.0–8.0)

## 2020-10-31 MED ORDER — CEPHALEXIN 500 MG PO CAPS: 500.0000 mg | ORAL_CAPSULE | Freq: Two times a day (BID) | ORAL | 0 refills | Status: AC

## 2020-10-31 NOTE — ED Provider Notes (Signed)
[facility]    CSN: [id number] Arrival date & time: 10/31/20  1555      History   Chief Complaint Chief Complaint  Patient presents with  . Dysuria    HPI Misty Graham is a 55 y.o. female presenting for onset of dysuria, urinary frequency and urgency yesterday.  Patient believes she may have a UTI.  States that "I get them all the time."  She says her last UTI was about 6 months ago.  Patient has been trying to increase her fluid intake.  She denies any fever, fatigue, abdominal pain, flank pain or back pain, pelvic pain, abnormal vaginal discharge/itching or odor, hematuria.  Not taking any OTC meds for symptoms.  No other concerns.   HPI  Past Medical History:  Diagnosis Date  . Depression   . Diabetes mellitus without complication (HCC)   . Family history of ovarian cancer    Pt is My Risk neg 5/16 except NBN VUS  . GERD (gastroesophageal reflux disease)   . Hypertension   . Iron deficiency     Patient Active Problem List   Diagnosis Date Noted  . ASCUS with positive high risk HPV cervical 07/08/2020  . Pneumonia due to COVID-19 virus 07/31/2019  . COVID-19 virus infection 07/30/2019  . Essential hypertension 07/30/2019  . Elevated lactic acid level 07/30/2019  . Diabetes mellitus type 2, controlled, without complications (HCC) 12/09/2017  . Hypercholesterolemia 12/09/2017  . Major depression in remission (HCC) 12/09/2017  . Overactive bladder 11/14/2016  . Neck pain on right side 10/21/2015  . Diabetes mellitus (HCC) 09/06/2015  . 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 Allergic rhinitis 07/18/2015  . Acid reflux 07/18/2015  . Restless leg 07/18/2015  . BP (high blood pressure) 04/04/2015  . Iron deficiency anemia 01/05/2015  . Cervical pain 10/29/2009    Past Surgical History:  Procedure Laterality Date  . BREAST BIOPSY Right 2014   calcs with Sankar, benign, with clip  . BREAST SURGERY Right    biopsy; Dr. Evette Cristal  . CERVICAL DISCECTOMY   03/15/2010  . CESAREAN SECTION  1991  . COLONOSCOPY    . LASIK    . VAGINAL BIRTH AFTER CESAREAN SECTION  1993 and 1997    OB History    Gravida  3   Para  3   Term  3   Preterm      AB      Living  3     SAB      IAB      Ectopic      Multiple      Live Births               Home Medications    Prior to Admission medications   Medication Sig Start Date End Date Taking? Authorizing Provider  cephALEXin (KEFLEX) 500 MG capsule Take 1 capsule (500 mg total) by mouth 2 (two) times daily for 7 days. 10/31/20 11/07/20 Yes Eusebio Friendly B, PA-C  atorvastatin (LIPITOR) 40 MG tablet Take 40 mg by mouth daily. 03/17/19   [provider]  cetirizine (ZYRTEC) 10 MG tablet Take 10 mg by mouth daily.    [provider]  Continuous Blood Gluc Sensor (FREESTYLE LIBRE 14 DAY SENSOR) MISC Apply topically. 06/06/20   [provider]  desvenlafaxine (PRISTIQ) 50 MG 24 hr tablet Take 50 mg by mouth daily.  11/20/18 11/20/19  [provider]  dexlansoprazole (DEXILANT) 60 MG capsule Take 1 capsule (60 mg total) by  mouth daily. 07/14/20   Midge Minium, MD  FEROSUL 325 (65 Fe) MG tablet Take 1 tablet (325 mg total) by mouth daily with breakfast. Patient taking differently: Take 325 mg by mouth daily with breakfast. 04/17/18   Maple Hudson., MD  glipiZIDE (GLUCOTROL XL) 5 MG 24 hr tablet Take 1 tablet (5 mg total) by mouth daily with breakfast. 04/17/18   Maple Hudson., MD  glucose blood [facility] VERIO) test strip To check blood sugar once daily 10/17/16   Joycelyn Man M, PA-C  hydrochlorothiazide (HYDRODIURIL) 12.5 MG tablet Take 12.5 mg by mouth daily. 05/13/19   [provider]  Ipratropium-Albuterol (COMBIVENT RESPIMAT) 20-100 MCG/ACT AERS respimat Inhale into the lungs. 08/01/19   [provider]  JANUVIA 100 MG tablet TAKE 1 TABLET BY MOUTH EVERY DAY Patient taking differently: Take 100 mg by mouth daily. 02/26/18    Margaretann Loveless, PA-C  lisinopril (ZESTRIL) 10 MG tablet Take 10 mg by mouth daily. 05/13/19   [provider]  metFORMIN (GLUCOPHAGE-XR) 500 MG 24 hr tablet Take 1,000 mg by mouth daily with breakfast.  05/08/19   [provider]  MYRBETRIQ 25 MG TB24 tablet TAKE 1 TABLET(25 MG) BY MOUTH DAILY 08/23/20   Nadara Mustard, MD  [facility] DELICA LANCETS FINE MISC To check blood sugar once a day DX:  E11.9 07/29/15   Lorie Phenix, MD  tolterodine (DETROL LA) 4 MG 24 hr capsule Take 1 capsule (4 mg total) by mouth daily. 08/25/20   Nadara Mustard, MD    Family History Family History  Problem Relation Age of Onset  . Lung cancer Mother   . COPD Father   . Hypertension Father   . Allergies Father   . Coronary artery disease Father   . Healthy Sister   . Heart attack Paternal Grandmother   . Ovarian cancer Paternal Grandmother   . Heart attack Paternal Grandfather   . Breast cancer Neg Hx     Social History Social History   Tobacco Use  . Smoking status: Never Smoker  . Smokeless tobacco: Never Used  Vaping Use  . Vaping Use: Never used  Substance Use Topics  . Alcohol use: No  . Drug use: No     Allergies   Patient has no known allergies.   Review of Systems Review of Systems  Constitutional: Negative for chills, fatigue and fever.  Gastrointestinal: Positive for abdominal pain. Negative for diarrhea, nausea and vomiting.  Genitourinary: Positive for dysuria, frequency and urgency. Negative for decreased urine volume, flank pain, hematuria, pelvic pain, vaginal bleeding, vaginal discharge and vaginal pain.  Musculoskeletal: Negative for back pain.  Skin: Negative for rash.     Physical Exam Triage Vital Signs ED Triage Vitals  Enc Vitals Group     BP 10/31/20 1610 126/66     Pulse Rate 10/31/20 1610 84     Resp 10/31/20 1610 18     Temp 10/31/20 1610 99.1 F (37.3 C)     Temp Source 10/31/20 1610 Oral     SpO2 10/31/20 1610 100 %     Weight  --      Height --      Head Circumference --      Peak Flow --      Pain Score 10/31/20 1608 6     Pain Loc --      Pain Edu? --      Excl. in GC? --    No  data found.  Updated Vital Signs BP 126/66 (BP Location: Left Arm)   Pulse 84   Temp 99.1 F (37.3 C) (Oral)   Resp 18   LMP 07/18/2010   SpO2 100%       Physical Exam Vitals and nursing note reviewed.  Constitutional:      General: She is not in acute distress.    Appearance: Normal appearance. She is not ill-appearing or toxic-appearing.  HENT:     Head: Normocephalic and atraumatic.     Nose: Nose normal.     Mouth/Throat:     Mouth: Mucous membranes are moist.     Pharynx: Oropharynx is clear.  Eyes:     General: No scleral icterus.       Right eye: No discharge.        Left eye: No discharge.     Conjunctiva/sclera: Conjunctivae normal.  Cardiovascular:     Rate and Rhythm: Normal rate and regular rhythm.     Heart sounds: Normal heart sounds.  Pulmonary:     Effort: Pulmonary effort is normal. No respiratory distress.     Breath sounds: Normal breath sounds.  Musculoskeletal:     Cervical back: Neck supple.  Skin:    General: Skin is dry.  Neurological:     General: No focal deficit present.     Mental Status: She is alert. Mental status is at baseline.     Motor: No weakness.     Gait: Gait normal.  Psychiatric:        Mood and Affect: Mood normal.        Behavior: Behavior normal.        Thought Content: Thought content normal.      UC Treatments / Results  Labs (all labs ordered are listed, but only abnormal results are displayed) Labs Reviewed  URINALYSIS, COMPLETE (UACMP) WITH MICROSCOPIC - Abnormal; Notable for the following components:      Result Value   Specific Gravity, Urine <1.005 (*)    Hgb urine dipstick TRACE (*)    Leukocytes,Ua TRACE (*)    Bacteria, UA FEW (*)    All other components within normal limits  URINE CULTURE    EKG   Radiology No results  found.  Procedures Procedures (including critical care time)  Medications Ordered in UC Medications - No data to display  Initial Impression / Assessment and Plan / UC Course  I have reviewed the triage vital signs and the nursing notes.  Pertinent labs & imaging results that were available during my care of the patient were reviewed by me and considered in my medical decision making (see chart for details).   [age] year old female presenting with urinary pain, urinary frequency and urgency since yesterday.  Urinalysis shows less than 1.005 specific gravity, trace blood and trace leukocytes.  Few bacteria also seen on microscopy.  Urine will be sent for culture and patient will be treated for UTI at this time with Keflex.  She declined any Pyridium.  Advised her to increase rest and fluids.  Advised someone will contact her with the results of the urine culture if her antibodies to be changed.  ED precautions for UTIs reviewed with patient.  She declined an AVS.   Final Clinical Impressions(s) / UC Diagnoses   Final diagnoses:  Acute cystitis with hematuria  Dysuria     Discharge Instructions     UTI: Based on either symptoms or urinalysis, you may have a urinary tract infection. We will  send the urine for culture and call with results in a few days. Begin antibiotics at this time. Your symptoms should be much improved over the next 2-3 days. Increase rest and fluid intake. If for some reason symptoms are worsening or not improving after a couple of days or the urine culture determines the antibiotics you are taking will not treat the infection, the antibiotics may be changed. Return or go to ER for fever, back pain, worsening urinary pain, discharge, increased blood in urine. May take Tylenol or Motrin OTC for pain relief or consider AZO if no contraindications     ED Prescriptions    Medication Sig Dispense Auth. Provider   cephALEXin (KEFLEX) 500 MG capsule Take 1 capsule (500 mg  total) by mouth 2 (two) times daily for 7 days. 14 capsule Shirlee Latch, PA-C     PDMP not reviewed this encounter.   Shirlee Latch, PA-C 10/31/20 1656

## 2020-10-31 NOTE — ED Triage Notes (Signed)
Pt is present with dysuria that stared yesterday. Pt denies any other sx

## 2020-10-31 NOTE — Discharge Instructions (Signed)

## 2020-11-02 LAB — URINE CULTURE

## 2020-11-08 ENCOUNTER — Ambulatory Visit: Payer: BC Managed Care – PPO | Admitting: Urology

## 2020-11-08 ENCOUNTER — Other Ambulatory Visit: Admission: RE | Admit: 2020-11-08 | Payer: BC Managed Care – PPO | Source: Home / Self Care

## 2020-11-08 ENCOUNTER — Other Ambulatory Visit: Payer: Self-pay | Admitting: *Deleted

## 2020-11-08 ENCOUNTER — Other Ambulatory Visit
Admission: RE | Admit: 2020-11-08 | Discharge: 2020-11-08 | Disposition: A | Discharge status: Home / Self Care | Payer: BC Managed Care – PPO | Attending: Urology | Admitting: Urology

## 2020-11-08 ENCOUNTER — Other Ambulatory Visit: Payer: Self-pay

## 2020-11-08 ENCOUNTER — Encounter: Payer: Self-pay | Admitting: Urology

## 2020-11-08 VITALS — BP 128/80 | HR 98 | Ht 66.0 in | Wt 165.0 lb

## 2020-11-08 DIAGNOSIS — N390 Urinary tract infection, site not specified: Secondary | ICD-10-CM

## 2020-11-08 LAB — URINALYSIS, COMPLETE (UACMP) WITH MICROSCOPIC
Bilirubin Urine: NEGATIVE
Glucose, UA: 500 mg/dL — AB
Ketones, ur: 15 mg/dL — AB
Nitrite: NEGATIVE
Specific Gravity, Urine: 1.02 (ref 1.005–1.030)
WBC, UA: >50 WBC/hpf (ref 0–5)
pH: 5.5 (ref 5.0–8.0)

## 2020-11-08 LAB — BLADDER SCAN AMB NON-IMAGING

## 2020-11-08 MED ORDER — SULFAMETHOXAZOLE-TRIMETHOPRIM 800-160 MG PO TABS: 1.0000 | ORAL_TABLET | Freq: Two times a day (BID) | ORAL | 0 refills | Status: DC

## 2020-11-08 MED ORDER — ESTRADIOL 0.1 MG/GM VA CREA: TOPICAL_CREAM | VAGINAL | 12 refills | Status: DC

## 2020-11-08 NOTE — Progress Notes (Signed)
   11/08/20 2:08 PM   Jiles Harold 18-Oct-1965 [id number]  CC: Recurrent UTI  HPI: 55 year old female referred for recurrent UTIs.  She was recently seen in urgent care on 10/31/2020 with dysuria and UA suspicious for infection with 6-10 WBCs, 0-5 RBCs, and few bacteria.  Urine culture showed multiple species.  She was treated with both nitrofurantoin and Keflex without any improvement in her UTI symptoms.  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  Urinalysis today with 11-20 squamous cells, trace leukocytes, greater than 50 WBCs, 11-20 RBCs, many bacteria.   PMH: Past Medical History:  Diagnosis Date  . Depression   . Diabetes mellitus without complication (HCC)   . Family history of ovarian cancer    Pt is My Risk neg 5/16 except NBN VUS  . GERD (gastroesophageal reflux disease)   . Hypertension   . Iron deficiency     Surgical History: Past Surgical History:  Procedure Laterality Date  . BREAST BIOPSY Right 2014   calcs with Sankar, benign, with clip  . BREAST SURGERY Right    biopsy; Dr. Evette Cristal  . CERVICAL DISCECTOMY  03/15/2010  . CESAREAN SECTION  1991  . COLONOSCOPY    . LASIK    . VAGINAL BIRTH AFTER CESAREAN SECTION  1993 and 1997   Family History: Family History  Problem Relation Age of Onset  . Lung cancer Mother   . COPD Father   . Hypertension Father   . Allergies Father   . Coronary artery disease Father   . Healthy Sister   . Heart attack Paternal Grandmother   . Ovarian cancer Paternal Grandmother   . Heart attack Paternal Grandfather   . Breast cancer Neg Hx     Social History:  reports that she has never smoked. She has never used smokeless tobacco. She reports that she does not drink alcohol and does not use drugs.  Physical Exam: BP 128/80   Pulse 98   Ht 5\' 6"  (1.676 m)   Wt 165 lb (74.8 kg)   LMP 07/18/2010   BMI 26.63 kg/m    Constitutional:  Alert and oriented, No acute distress. Cardiovascular: No clubbing,  cyanosis, or edema. Respiratory: Normal respiratory effort, no increased work of breathing. GI: Abdomen is soft, nontender, nondistended, no abdominal masses   Laboratory Data: Reviewed   Assessment & Plan:   55 year old female with recurrent UTIs, and persistent UTI symptoms despite recent course of Keflex and nitrofurantoin.  We discussed the evaluation and treatment of patients with recurrent UTIs at length.  We specifically discussed the differences between asymptomatic bacteriuria and true urinary tract infection.  We discussed the AUA definition of recurrent UTI of at least 2 culture proven symptomatic acute cystitis episodes in a 41-month period, or 3 within a 1 year period.  We discussed the importance of culture directed antibiotic treatment, and antibiotic stewardship.  First-line therapy includes nitrofurantoin(5 days), Bactrim(3 days), or fosfomycin(3 g single dose).  Possible etiologies of recurrent infection include periurethral tissue atrophy in postmenopausal woman, constipation, sexual activity, incomplete emptying, anatomic abnormalities, and even genetic predisposition.  Finally, we discussed the role of perineal hygiene, timed voiding, adequate hydration, topical vaginal estrogen, cranberry prophylaxis, and low-dose antibiotic prophylaxis.  Bactrim DS twice daily x5 days Topical estrogen cream nightly Cranberry tablet prophylaxis RTC 6 weeks symptom check Follow-up urine culture and atypicals  8-month, MD 11/08/2020  [facility] [address] [place], [place] [phone]

## 2020-11-16 ENCOUNTER — Other Ambulatory Visit: Payer: Self-pay

## 2020-11-16 DIAGNOSIS — N390 Urinary tract infection, site not specified: Secondary | ICD-10-CM

## 2020-11-17 ENCOUNTER — Other Ambulatory Visit: Payer: Self-pay

## 2020-11-17 ENCOUNTER — Other Ambulatory Visit: Payer: BC Managed Care – PPO

## 2020-11-17 DIAGNOSIS — N390 Urinary tract infection, site not specified: Secondary | ICD-10-CM

## 2020-11-17 LAB — URINALYSIS, COMPLETE
Bilirubin, UA: NEGATIVE
Ketones, UA: NEGATIVE
Leukocytes,UA: NEGATIVE
Nitrite, UA: NEGATIVE
Protein,UA: NEGATIVE
RBC, UA: NEGATIVE
Specific Gravity, UA: 1.015 (ref 1.005–1.030)
Urobilinogen, Ur: 2 mg/dL — ABNORMAL HIGH (ref 0.2–1.0)
pH, UA: 6 (ref 5.0–7.5)

## 2020-11-17 LAB — MICROSCOPIC EXAMINATION

## 2020-11-21 LAB — MYCOPLASMA / UREAPLASMA CULTURE
Mycoplasma hominis Culture: POSITIVE — AB
Ureaplasma urealyticum: POSITIVE — AB

## 2020-11-21 LAB — CULTURE, URINE COMPREHENSIVE

## 2020-11-22 ENCOUNTER — Telehealth: Payer: Self-pay

## 2020-11-22 DIAGNOSIS — A493 Mycoplasma infection, unspecified site: Secondary | ICD-10-CM

## 2020-11-22 MED ORDER — DOXYCYCLINE HYCLATE 100 MG PO CAPS: 100.0000 mg | ORAL_CAPSULE | Freq: Two times a day (BID) | ORAL | 0 refills | Status: AC

## 2020-11-22 NOTE — Telephone Encounter (Signed)
See mychart message. Rx sent in.

## 2020-11-22 NOTE — Telephone Encounter (Signed)
-----   Message from Sondra Come, MD sent at 11/22/2020  8:07 AM EDT ----- Both aytpicals positive, lets do Doxycycline 100mg  BID  x 7 days, thanks  , MD 11/22/2020

## 2020-12-08 ENCOUNTER — Telehealth: Payer: Self-pay | Admitting: *Deleted

## 2020-12-08 NOTE — Telephone Encounter (Signed)
Returned patient's call received from Triage line, left Vm for patient to call back.

## 2020-12-09 NOTE — Telephone Encounter (Signed)
Response to patient's mychart message:  Would recommend PA visit for UA and culture, and consider CT or renal ultrasound in the setting of her persistent infections if urinalysis with microscopic blood or another infection   Legrand Rams, MD

## 2020-12-09 NOTE — Telephone Encounter (Signed)
Contacted patient and offered an appt this morning with Michiel Cowboy, PAC, patient is unable to come in this morning due to her son's wedding. She was given advise to increase water intake and utilize AZO to help with symptoms. Patient was offered an appt on Monday, she states she will call back if she wishes to make an appt

## 2020-12-14 ENCOUNTER — Encounter: Payer: Self-pay | Admitting: Sports Medicine

## 2020-12-14 ENCOUNTER — Other Ambulatory Visit: Payer: Self-pay

## 2020-12-14 ENCOUNTER — Ambulatory Visit
Admission: EM | Admit: 2020-12-14 | Discharge: 2020-12-14 | Disposition: A | Discharge status: Home / Self Care | Payer: BC Managed Care – PPO | Attending: Sports Medicine | Admitting: Sports Medicine

## 2020-12-14 DIAGNOSIS — H9202 Otalgia, left ear: Secondary | ICD-10-CM

## 2020-12-14 DIAGNOSIS — H60502 Unspecified acute noninfective otitis externa, left ear: Secondary | ICD-10-CM

## 2020-12-14 MED ORDER — CIPROFLOXACIN-DEXAMETHASONE 0.3-0.1 % OT SUSP: 4.0000 [drp] | Freq: Two times a day (BID) | OTIC | 0 refills | Status: DC

## 2020-12-14 NOTE — Discharge Instructions (Addendum)
As we discussed, your inner ear is not infected.  You do have some redness in your external auditory canal which is consistent with otitis externa.  This is also called swimmer's ear.  I did send in some eardrops to your pharmacy. Please see educational handouts. Tylenol or Motrin for any fever or discomfort. If symptoms persist please see your primary care provider. If symptoms worsen that please go to the ER. Follow-up here as needed.

## 2020-12-14 NOTE — ED Triage Notes (Signed)
Patient complains of left ear pain x 2-3 weeks. States that pain has been constant and she has been taking ibuprofen without complete relief.

## 2020-12-14 NOTE — ED Provider Notes (Signed)
[facility]    CSN: [id number] Arrival date & time: 12/14/20  1531      History   Chief Complaint Chief Complaint  Patient presents with   Ear Pain    left    HPI Misty Graham is a 55 y.o. female.   55 year old female who presents for evaluation of the above issue.  Normally sees Dr. Einar Crow at [facility] for ongoing medical care.  He was unavailable to see her today.  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  She reports having left ear pain for a few weeks now.  No drainage.  No fever shakes chills.  No nausea vomiting diarrhea.  No urinary or abdominal symptoms.  No increased congestion.  No URI symptoms.  No cough.  She does have a history of seasonal allergies and takes Zyrtec.  No chest pain or shortness of breath.  No other symptoms other than the ear pain.  No red flag signs or symptoms elicited on history.   Past Medical History:  Diagnosis Date   Depression    Diabetes mellitus without complication (HCC)    Family history of ovarian cancer    Pt is My Risk neg 5/16 except NBN VUS   GERD (gastroesophageal reflux disease)    Hypertension    Iron deficiency     Patient Active Problem List   Diagnosis Date Noted   ASCUS with positive high risk HPV cervical 07/08/2020   Pneumonia due to COVID-19 virus 07/31/2019   COVID-19 virus infection 07/30/2019   Essential hypertension 07/30/2019   Elevated lactic acid level 07/30/2019   Diabetes mellitus type 2, controlled, without complications (HCC) 12/09/2017   Hypercholesterolemia 12/09/2017   Major depression in remission (HCC) 12/09/2017   Overactive bladder 11/14/2016   Neck pain on right side 10/21/2015   Diabetes mellitus (HCC) 09/06/2015   Adaptation reaction 07/18/2015   Allergic rhinitis 07/18/2015   Acid reflux 07/18/2015   Restless leg 07/18/2015   BP (high blood pressure) 04/04/2015   Iron deficiency anemia 01/05/2015   Cervical pain 10/29/2009    Past  Surgical History:  Procedure Laterality Date   BREAST BIOPSY Right 2014   calcs with Sankar, benign, with clip   BREAST SURGERY Right    biopsy; Dr. Evette Cristal   CERVICAL DISCECTOMY  03/15/2010   CESAREAN SECTION  1991   COLONOSCOPY     LASIK     VAGINAL BIRTH AFTER CESAREAN SECTION  1993 and 1997    OB History     Gravida  3   Para  3   Term  3   Preterm      AB      Living  3      SAB      IAB      Ectopic      Multiple      Live Births               Home Medications    Prior to Admission medications   Medication Sig Start Date End Date Taking? Authorizing Provider  atorvastatin (LIPITOR) 40 MG tablet Take 40 mg by mouth daily. 03/17/19  Yes [provider]  cetirizine (ZYRTEC) 10 MG tablet Take 10 mg by mouth daily.   Yes [provider]  ciprofloxacin-dexamethasone (CIPRODEX) OTIC suspension Place 4 drops into the left ear 2 (two) times daily. 12/14/20  Yes Delton See, MD  Continuous Blood Gluc Sensor (FREESTYLE LIBRE 14 DAY SENSOR)  MISC Apply topically. 06/06/20  Yes [provider]  desvenlafaxine (PRISTIQ) 50 MG 24 hr tablet Take 50 mg by mouth daily.  11/20/18  Yes [provider]  dexlansoprazole (DEXILANT) 60 MG capsule Take 1 capsule (60 mg total) by mouth daily. 07/14/20  Yes Midge MiniumWohl, Darren, MD  estradiol (ESTRACE) 0.1 MG/GM vaginal cream Estrogen Cream Instruction Discard applicator Apply pea sized amount to tip of finger to urethra before bed. Wash hands well after application. Use Monday, Wednesday and Friday 11/08/20  Yes Sondra ComeSninsky, Brian C, MD  FEROSUL 325 (65 Fe) MG tablet Take 1 tablet (325 mg total) by mouth daily with breakfast. Patient taking differently: Take 325 mg by mouth daily with breakfast. 04/17/18  Yes Maple HudsonGilbert, Richard L Jr., MD  glipiZIDE (GLUCOTROL XL) 5 MG 24 hr tablet Take 1 tablet (5 mg total) by mouth daily with breakfast. 04/17/18  Yes Maple HudsonGilbert, Richard L Jr., MD  glucose blood [facility](ONETOUCH VERIO)  test strip To check blood sugar once daily 10/17/16  Yes Joycelyn ManBurnette, Jennifer M, PA-C  hydrochlorothiazide (HYDRODIURIL) 12.5 MG tablet Take 12.5 mg by mouth daily. 05/13/19  Yes [provider]  Ipratropium-Albuterol (COMBIVENT RESPIMAT) 20-100 MCG/ACT AERS respimat Inhale into the lungs. 08/01/19  Yes [provider]  JANUVIA 100 MG tablet TAKE 1 TABLET BY MOUTH EVERY DAY Patient taking differently: Take 100 mg by mouth daily. 02/26/18  Yes Margaretann LovelessBurnette, Jennifer M, PA-C  lisinopril (ZESTRIL) 10 MG tablet Take 10 mg by mouth daily. 05/13/19  Yes [provider]  metFORMIN (GLUCOPHAGE-XR) 500 MG 24 hr tablet Take 1,000 mg by mouth daily with breakfast.  05/08/19  Yes [provider]  MYRBETRIQ 25 MG TB24 tablet TAKE 1 TABLET(25 MG) BY MOUTH DAILY 08/23/20  Yes Nadara MustardHarris, Robert P, MD  [facility] LANCETS FINE MISC To check blood sugar once a day DX:  E11.9 07/29/15  Yes Lorie PhenixMaloney, Nancy, MD  tolterodine (DETROL LA) 4 MG 24 hr capsule Take 1 capsule (4 mg total) by mouth daily. 08/25/20  Yes Nadara MustardHarris, Robert P, MD  sulfamethoxazole-trimethoprim (BACTRIM DS) 800-160 MG tablet Take 1 tablet by mouth 2 (two) times daily. 11/08/20   Sondra ComeSninsky, Brian C, MD    Family History Family History  Problem Relation Age of Onset   Lung cancer Mother    COPD Father    Hypertension Father    Allergies Father    Coronary artery disease Father    Healthy Sister    Heart attack Paternal Grandmother    Ovarian cancer Paternal Grandmother    Heart attack Paternal Grandfather    Breast cancer Neg Hx     Social History Social History   Tobacco Use   Smoking status: Never   Smokeless tobacco: Never  Vaping Use   Vaping Use: Never used  Substance Use Topics   Alcohol use: No   Drug use: No     Allergies   Patient has no known allergies.   Review of Systems Review of Systems  Constitutional:  Negative for activity change, appetite change, chills, diaphoresis, fatigue and fever.   HENT:  Positive for ear pain. Negative for congestion, ear discharge, postnasal drip, rhinorrhea, sinus pressure, sinus pain, sneezing, sore throat and trouble swallowing.   Eyes:  Negative for pain.  Respiratory:  Negative for cough, chest tightness, shortness of breath and wheezing.   Cardiovascular:  Negative for chest pain and palpitations.  Gastrointestinal:  Negative for abdominal pain, diarrhea, nausea and vomiting.  Genitourinary:  Negative for dysuria.  Musculoskeletal:  Negative for back pain, myalgias and neck pain.  Skin:  Negative for color change, pallor, rash and wound.  Allergic/Immunologic: Positive for environmental allergies.  Neurological:  Negative for dizziness, light-headedness and headaches.  All other systems reviewed and are negative.   Physical Exam Triage Vital Signs ED Triage Vitals  Enc Vitals Group     BP 12/14/20 1641 131/75     Pulse Rate 12/14/20 1641 90     Resp 12/14/20 1641 18     Temp 12/14/20 1641 98.6 F (37 C)     Temp Source 12/14/20 1641 Oral     SpO2 12/14/20 1641 99 %     Weight 12/14/20 1638 165 lb (74.8 kg)     Height 12/14/20 1638 5\' 4"  (1.626 m)     Head Circumference --      Peak Flow --      Pain Score 12/14/20 1638 5     Pain Loc --      Pain Edu? --      Excl. in GC? --    No data found.  Updated Vital Signs BP 131/75 (BP Location: Right Arm)   Pulse 90   Temp 98.6 F (37 C) (Oral)   Resp 18   Ht 5\' 4"  (1.626 m)   Wt 74.8 kg   LMP 07/18/2010   SpO2 99%   BMI 28.32 kg/m   Visual Acuity Right Eye Distance:   Left Eye Distance:   Bilateral Distance:    Right Eye Near:   Left Eye Near:    Bilateral Near:     Physical Exam Vitals and nursing note reviewed.  Constitutional:      General: She is not in acute distress.    Appearance: Normal appearance. She is not ill-appearing, toxic-appearing or diaphoretic.  HENT:     Head: Normocephalic and atraumatic.     Right Ear: Hearing and tympanic membrane normal.      Left Ear: Hearing normal.     Ears:     Comments: Left ear external auditory canal shows some erythema consistent with otitis externa.  On further history it appears as though she has been swimming but claims she has not gotten any water in her ear.    Nose: Nose normal. No congestion or rhinorrhea.     Mouth/Throat:     Mouth: Mucous membranes are moist.  Eyes:     General: No scleral icterus.       Right eye: No discharge.        Left eye: No discharge.     Conjunctiva/sclera: Conjunctivae normal.     Pupils: Pupils are equal, round, and reactive to light.  Cardiovascular:     Rate and Rhythm: Normal rate and regular rhythm.     Pulses: Normal pulses.     Heart sounds: Normal heart sounds. No murmur heard.   No friction rub. No gallop.  Pulmonary:     Effort: Pulmonary effort is normal.     Breath sounds: Normal breath sounds. No stridor. No wheezing, rhonchi or rales.  Musculoskeletal:     Cervical back: Normal range of motion and neck supple. No rigidity or tenderness.  Lymphadenopathy:     Cervical: No cervical adenopathy.  Skin:    General: Skin is warm and dry.     Capillary Refill: Capillary refill takes less than 2 seconds.  Neurological:     General: No focal deficit present.     Mental Status: She is alert and oriented  to person, place, and time.     UC Treatments / Results  Labs (all labs ordered are listed, but only abnormal results are displayed) Labs Reviewed - No data to display  EKG   Radiology No results found.  Procedures Procedures (including critical care time)  Medications Ordered in UC Medications - No data to display  Initial Impression / Assessment and Plan / UC Course  I have reviewed the triage vital signs and the nursing notes.  Pertinent labs & imaging results that were available during my care of the patient were reviewed by me and considered in my medical decision making (see chart for details).  Clinical impression: Left ear  pain with what appears to be otitis externa.  Patient's been symptomatic for a few weeks.  No other symptoms.  Treatment plan: 1.  The findings and treatment plan were discussed in detail with the patient.  Patient was in agreement. 2.  We will go ahead and treat her for swimmer's ear with Ciprodex.  Sent that to the pharmacy. 3.  Educational handouts were provided. 4.  Tylenol or Motrin for any fever or discomfort. 5.  If symptoms persist I asked her to see her primary care provider. 6.  Certainly if her symptoms worsen then she should go to the ER. 7.  She was discharged in stable condition and she will follow-up here as needed.    Final Clinical Impressions(s) / UC Diagnoses   Final diagnoses:  Left ear pain  Acute otitis externa of left ear, unspecified type     Discharge Instructions      As we discussed, your inner ear is not infected.  You do have some redness in your external auditory canal which is consistent with otitis externa.  This is also called swimmer's ear.  I did send in some eardrops to your pharmacy. Please see educational handouts. Tylenol or Motrin for any fever or discomfort. If symptoms persist please see your primary care provider. If symptoms worsen that please go to the ER. Follow-up here as needed.     ED Prescriptions     Medication Sig Dispense Auth. Provider   ciprofloxacin-dexamethasone (CIPRODEX) OTIC suspension Place 4 drops into the left ear 2 (two) times daily. 7.5 mL Delton See, MD      PDMP not reviewed this encounter.   Delton See, MD 12/14/20 [phone]

## 2020-12-20 ENCOUNTER — Ambulatory Visit: Payer: BC Managed Care – PPO | Admitting: Urology

## 2021-01-09 ENCOUNTER — Other Ambulatory Visit: Payer: Self-pay

## 2021-01-09 ENCOUNTER — Ambulatory Visit (INDEPENDENT_AMBULATORY_CARE_PROVIDER_SITE_OTHER): Payer: BC Managed Care – PPO | Admitting: Obstetrics & Gynecology

## 2021-01-09 ENCOUNTER — Other Ambulatory Visit (HOSPITAL_COMMUNITY)
Admission: RE | Admit: 2021-01-09 | Discharge: 2021-01-09 | Disposition: A | Discharge status: Home / Self Care | Payer: BC Managed Care – PPO | Source: Ambulatory Visit | Attending: Obstetrics & Gynecology | Admitting: Obstetrics & Gynecology

## 2021-01-09 ENCOUNTER — Encounter: Payer: Self-pay | Admitting: Obstetrics & Gynecology

## 2021-01-09 VITALS — BP 120/80 | Ht 66.0 in | Wt 166.0 lb

## 2021-01-09 DIAGNOSIS — N870 Mild cervical dysplasia: Secondary | ICD-10-CM

## 2021-01-09 NOTE — Progress Notes (Signed)
HPI:  Patient is a 55 y.o. [id number] presenting for follow up evaluation of abnormal PAP smear in the past.  Her last PAP was 6 months ago and was abnormal: ASCUS w HPV . She has had a prior colposcopy. Prior biopsies (if done) were  CIN I .  Prior PAP was 2018 and normal.  PMHx: She  has a past medical history of Depression, Diabetes mellitus without complication (HCC), Family history of ovarian cancer, GERD (gastroesophageal reflux disease), Hypertension, and Iron deficiency. Also,  has a past surgical history that includes Cervical discectomy (03/15/2010); Vaginal birth after Cesarean section (1993 and 1997); Cesarean section (1991); Breast surgery (Right); LASIK; Colonoscopy; and Breast biopsy (Right, 2014)., family history includes Allergies in her father; COPD in her father; Coronary artery disease in her father; Healthy in her sister; Heart attack in her paternal grandfather and paternal grandmother; Hypertension in her father; Lung cancer in her mother; Ovarian cancer in her paternal grandmother.,  reports that she has never smoked. She has never used smokeless tobacco. She reports that she does not drink alcohol and does not use drugs.  She has a current medication list which includes the following prescription(s): atorvastatin, cetirizine, freestyle libre 14 day sensor, desvenlafaxine, dexlansoprazole, estradiol, ferosul, glipizide, glucose blood, hydrochlorothiazide, januvia, lisinopril, metformin, onetouch delica lancets fine, tolterodine, ciprofloxacin-dexamethasone, combivent respimat, myrbetriq, and sulfamethoxazole-trimethoprim. Also, has No Known Allergies.  Review of Systems  All other systems reviewed and are negative.  Objective: BP 120/80   Ht 5\' 6"  (1.676 m)   Wt 166 lb (75.3 kg)   LMP 07/18/2010   BMI 26.79 kg/m  Filed Weights   01/09/21 0830  Weight: 166 lb (75.3 kg)   Body mass index is 26.79 kg/m.  Physical examination Physical Exam Constitutional:      General:  She is not in acute distress.    Appearance: She is well-developed.  Genitourinary:     Right Labia: No rash or tenderness.    Left Labia: No tenderness or rash.    No vaginal erythema or bleeding.      Right Adnexa: not tender and no mass present.    Left Adnexa: not tender and no mass present.    No cervical motion tenderness, discharge, polyp or nabothian cyst.     Uterus is not enlarged.     No uterine mass detected.    Pelvic exam was performed with patient in the lithotomy position.  HENT:     Head: Normocephalic and atraumatic.     Nose: Nose normal.  Abdominal:     General: There is no distension.     Palpations: Abdomen is soft.     Tenderness: There is no abdominal tenderness.  Musculoskeletal:        General: Normal range of motion.  Neurological:     Mental Status: She is alert and oriented to person, place, and time.     Cranial Nerves: No cranial nerve deficit.  Skin:    General: Skin is warm and dry.  Psychiatric:        Attention and Perception: Attention normal.        Mood and Affect: Mood and affect normal.        Speech: Speech normal.        Behavior: Behavior normal.        Thought Content: Thought content normal.        Judgment: Judgment normal.    ASSESSMENT:  History of Cervical Dysplasia - CIN I  Plan:  1.  I discussed the grading system of pap smears and HPV high risk viral types.   2. Follow up PAP 6 months, vs intervention if high grade dysplasia identified. 3. Treatment of persistantly abnormal PAP smears and cervical dysplasia, even mild, is discussed w pt today in detail, as well as the pros and cons of Cryo and LEEP procedures. Will consider and discuss after results.  A total of 20 minutes were spent face-to-face with the patient as well as preparation, review, communication, and documentation during this encounter.    [name], MD, [facility], [facility] 01/09/2021  8:51 AM

## 2021-01-11 LAB — CYTOLOGY - PAP: Diagnosis: UNDETERMINED — AB

## 2021-01-23 ENCOUNTER — Other Ambulatory Visit: Payer: Self-pay | Admitting: Obstetrics & Gynecology

## 2021-05-12 ENCOUNTER — Other Ambulatory Visit: Payer: Self-pay | Admitting: Obstetrics & Gynecology

## 2021-05-12 DIAGNOSIS — R921 Mammographic calcification found on diagnostic imaging of breast: Secondary | ICD-10-CM

## 2021-06-29 ENCOUNTER — Ambulatory Visit
Admission: RE | Admit: 2021-06-29 | Discharge: 2021-06-29 | Disposition: A | Discharge status: Home / Self Care | Payer: BC Managed Care – PPO | Source: Ambulatory Visit | Attending: Obstetrics & Gynecology | Admitting: Obstetrics & Gynecology

## 2021-06-29 ENCOUNTER — Other Ambulatory Visit: Payer: Self-pay

## 2021-06-29 DIAGNOSIS — R921 Mammographic calcification found on diagnostic imaging of breast: Secondary | ICD-10-CM | POA: Diagnosis not present

## 2021-07-12 ENCOUNTER — Ambulatory Visit: Payer: BC Managed Care – PPO | Admitting: Obstetrics & Gynecology

## 2021-07-18 ENCOUNTER — Other Ambulatory Visit: Payer: Self-pay

## 2021-07-18 ENCOUNTER — Telehealth: Payer: Self-pay | Admitting: Obstetrics & Gynecology

## 2021-07-18 DIAGNOSIS — K2100 Gastro-esophageal reflux disease with esophagitis, without bleeding: Secondary | ICD-10-CM

## 2021-07-18 MED ORDER — DEXLANSOPRAZOLE 60 MG PO CPDR: 60.0000 mg | DELAYED_RELEASE_CAPSULE | Freq: Every day | ORAL | 0 refills | Status: DC

## 2021-07-18 NOTE — Telephone Encounter (Signed)
Lm for pt to call office back to set up annual appt with RPH. OK to work her in on his schedule in March.

## 2021-07-19 NOTE — Telephone Encounter (Signed)
PA submitted via [email or web address] [facility], awaiting response

## 2021-07-20 ENCOUNTER — Telehealth: Payer: Self-pay

## 2021-07-20 DIAGNOSIS — K2100 Gastro-esophageal reflux disease with esophagitis, without bleeding: Secondary | ICD-10-CM

## 2021-07-20 NOTE — Telephone Encounter (Signed)
Patient made 1 year follow up appointment with Dr. Allen Norris on 09/04/21 she states on 07/18/21 Dexilant was called in for her for 30 days but she states her insurance has not been covering [place] since last summer and she was change to a different medication. She does not remember what medication she was change to.

## 2021-07-21 NOTE — Telephone Encounter (Signed)
Dexilant no longer covered by pt's insurance.... prior authorization also denied... Please advise if okay to send in alternative.Marland KitchenMarland KitchenMarland Kitchen

## 2021-07-24 MED ORDER — PANTOPRAZOLE SODIUM 40 MG PO TBEC: 40.0000 mg | DELAYED_RELEASE_TABLET | Freq: Every day | ORAL | 1 refills | Status: DC

## 2021-07-24 NOTE — Telephone Encounter (Signed)
Rx sent through e-scribe  

## 2021-08-23 ENCOUNTER — Ambulatory Visit (INDEPENDENT_AMBULATORY_CARE_PROVIDER_SITE_OTHER): Payer: BC Managed Care – PPO | Admitting: Obstetrics & Gynecology

## 2021-08-23 ENCOUNTER — Other Ambulatory Visit: Payer: Self-pay

## 2021-08-23 ENCOUNTER — Other Ambulatory Visit (HOSPITAL_COMMUNITY)
Admission: RE | Admit: 2021-08-23 | Discharge: 2021-08-23 | Disposition: A | Discharge status: Home / Self Care | Payer: BC Managed Care – PPO | Source: Ambulatory Visit | Attending: Obstetrics & Gynecology | Admitting: Obstetrics & Gynecology

## 2021-08-23 ENCOUNTER — Encounter: Payer: Self-pay | Admitting: Obstetrics & Gynecology

## 2021-08-23 VITALS — BP 120/80 | Ht 66.0 in | Wt 163.0 lb

## 2021-08-23 DIAGNOSIS — N3281 Overactive bladder: Secondary | ICD-10-CM

## 2021-08-23 DIAGNOSIS — Z01419 Encounter for gynecological examination (general) (routine) without abnormal findings: Secondary | ICD-10-CM | POA: Diagnosis not present

## 2021-08-23 DIAGNOSIS — N870 Mild cervical dysplasia: Secondary | ICD-10-CM

## 2021-08-23 MED ORDER — TOLTERODINE TARTRATE ER 4 MG PO CP24: 4.0000 mg | ORAL_CAPSULE | Freq: Every day | ORAL | 3 refills | Status: DC

## 2021-08-23 NOTE — Patient Instructions (Addendum)
PAP every 6 months ?Mammogram every year ?Colonoscopy every 10 years ?Labs yearly (with PCP) ? ?Thank you for choosing [facility]. As part of our ongoing efforts to improve patient experience, we would appreciate your feedback. Please fill out the short survey that you will receive by mail or MyChart. Your opinion is important to [place]! ?- Dr. Tiburcio Pea ? ?

## 2021-08-23 NOTE — Progress Notes (Signed)
?HPI: ?     [name] is a 56 y.o. [phone] who LMP was in the past, she presents today for her annual examination.  The patient has no complaints today. OAB controlled w Detrol LA, sometimes has side effect of hesitancy.  The patient is sexually active. Herlast pap: was abnormal: ASCUS 6 mos ago; CIN I by Biopsy 12 mos ago; and last mammogram: was normal.  The patient does perform self breast exams.  There is no notable family history of breast or ovarian cancer in her family. The patient is not taking hormone replacement therapy. Patient denies post-menopausal vaginal bleeding.   The patient has regular exercise: yes. The patient denies current symptoms of depression.   ? ?GYN Hx: ?Last Colonoscopy: 7 yrs  ago. Normal.  ?Last DEXA:  never  ago.   ? ?PMHx: ?Past Medical History:  ?Diagnosis Date  ? Depression   ? Diabetes mellitus without complication (HCC)   ? Family history of ovarian cancer   ? Pt is My Risk neg 5/16 except NBN VUS  ? GERD (gastroesophageal reflux disease)   ? Hypertension   ? Iron deficiency   ? ?Past Surgical History:  ?Procedure Laterality Date  ? BREAST BIOPSY Right 2014  ? calcs with Sankar, benign, with clip  ? BREAST SURGERY Right   ? biopsy; Dr. Evette Cristal  ? CERVICAL DISCECTOMY  03/15/2010  ? CESAREAN SECTION  1991  ? COLONOSCOPY    ? LASIK    ? VAGINAL BIRTH AFTER CESAREAN SECTION  1993 and 1997  ? ?Family History  ?Problem Relation Age of Onset  ? Lung cancer Mother   ? COPD Father   ? Hypertension Father   ? Allergies Father   ? Coronary artery disease Father   ? Healthy Sister   ? Heart attack Paternal Grandmother   ? Ovarian cancer Paternal Grandmother   ? Heart attack Paternal Grandfather   ? Breast cancer Neg Hx   ? ?Social History  ? ?Tobacco Use  ? Smoking status: Never  ? Smokeless tobacco: Never  ?Vaping Use  ? Vaping Use: Never used  ?Substance Use Topics  ? Alcohol use: No  ? Drug use: No  ? ? ?Current Outpatient Medications:  ?  atorvastatin (LIPITOR) 40 MG tablet,  Take 40 mg by mouth daily., Disp: , Rfl:  ?  cetirizine (ZYRTEC) 10 MG tablet, Take 10 mg by mouth daily., Disp: , Rfl:  ?  Continuous Blood Gluc Sensor (FREESTYLE LIBRE 14 DAY SENSOR) MISC, Apply topically., Disp: , Rfl:  ?  desvenlafaxine (PRISTIQ) 50 MG 24 hr tablet, Take 50 mg by mouth daily. , Disp: , Rfl:  ?  glipiZIDE (GLUCOTROL XL) 5 MG 24 hr tablet, Take 1 tablet (5 mg total) by mouth daily with breakfast., Disp: 30 tablet, Rfl: 0 ?  glucose blood (ONETOUCH VERIO) test strip, To check blood sugar once daily, Disp: 100 each, Rfl: 5 ?  hydrochlorothiazide (HYDRODIURIL) 12.5 MG tablet, Take 12.5 mg by mouth daily., Disp: , Rfl:  ?  JANUVIA 100 MG tablet, TAKE 1 TABLET BY MOUTH EVERY DAY (Patient taking differently: Take 100 mg by mouth daily.), Disp: 90 tablet, Rfl: 1 ?  lisinopril (ZESTRIL) 10 MG tablet, Take 10 mg by mouth daily., Disp: , Rfl:  ?  metFORMIN (GLUCOPHAGE-XR) 500 MG 24 hr tablet, Take 1,000 mg by mouth daily with breakfast. , Disp: , Rfl:  ?  ONETOUCH DELICA LANCETS FINE MISC, To check blood sugar once a day  DX:  E11.9, Disp: 100 each, Rfl: 5 ?  pantoprazole (PROTONIX) 40 MG tablet, Take 1 tablet (40 mg total) by mouth daily., Disp: 30 tablet, Rfl: 1 ?  tolterodine (DETROL LA) 4 MG 24 hr capsule, Take 1 capsule (4 mg total) by mouth daily., Disp: 90 capsule, Rfl: 3 ?Allergies: Patient has no known allergies. ? ?Review of Systems  ?Constitutional:  Negative for chills, fever and malaise/fatigue.  ?HENT:  Negative for congestion, sinus pain and sore throat.   ?Eyes:  Negative for blurred vision and pain.  ?Respiratory:  Negative for cough and wheezing.   ?Cardiovascular:  Negative for chest pain and leg swelling.  ?Gastrointestinal:  Negative for abdominal pain, constipation, diarrhea, heartburn, nausea and vomiting.  ?Genitourinary:  Negative for dysuria, frequency, hematuria and urgency.  ?Musculoskeletal:  Negative for back pain, joint pain, myalgias and neck pain.  ?Skin:  Negative for  itching and rash.  ?Neurological:  Negative for dizziness, tremors and weakness.  ?Endo/Heme/Allergies:  Does not bruise/bleed easily.  ?Psychiatric/Behavioral:  Negative for depression. The patient is not nervous/anxious and does not have insomnia.   ? ?Objective: ?BP 120/80   Ht 5\' 6"  (1.676 m)   Wt 163 lb (73.9 kg)   LMP 07/18/2010   BMI 26.31 kg/m?   ?Filed Weights  ? 08/23/21 0847  ?Weight: 163 lb (73.9 kg)  ? Body mass index is 26.31 kg/m?10/23/21 ?Physical Exam ?Constitutional:   ?   General: She is not in acute distress. ?   Appearance: She is well-developed.  ?Genitourinary:  ?   Bladder, rectum and urethral meatus normal.  ?   No lesions in the vagina.  ?   Right Labia: No rash, tenderness or lesions. ?   Left Labia: No tenderness, lesions or rash. ?   No vaginal bleeding.  ? ?   Right Adnexa: not tender and no mass present. ?   Left Adnexa: not tender and no mass present. ?   No cervical motion tenderness, friability, lesion or polyp.  ?   Uterus is not enlarged.  ?   No uterine mass detected. ?   Pelvic exam was performed with patient in the lithotomy position.  ?Breasts: ?   Right: No mass, skin change or tenderness.  ?   Left: No mass, skin change or tenderness.  ?HENT:  ?   Head: Normocephalic and atraumatic. No laceration.  ?   Right Ear: Hearing normal.  ?   Left Ear: Hearing normal.  ?   Mouth/Throat:  ?   Pharynx: Uvula midline.  ?Eyes:  ?   Pupils: Pupils are equal, round, and reactive to light.  ?Neck:  ?   Thyroid: No thyromegaly.  ?Cardiovascular:  ?   Rate and Rhythm: Normal rate and regular rhythm.  ?   Heart sounds: No murmur heard. ?  No friction rub. No gallop.  ?Pulmonary:  ?   Effort: Pulmonary effort is normal. No respiratory distress.  ?   Breath sounds: Normal breath sounds. No wheezing.  ?Abdominal:  ?   General: Bowel sounds are normal. There is no distension.  ?   Palpations: Abdomen is soft.  ?   Tenderness: There is no abdominal tenderness. There is no rebound.  ?Musculoskeletal:      ?   General: Normal range of motion.  ?   Cervical back: Normal range of motion and neck supple.  ?Neurological:  ?   Mental Status: She is alert and oriented to person, place, and time.  ?  Cranial Nerves: No cranial nerve deficit.  ?Skin: ?   General: Skin is warm and dry.  ?Psychiatric:     ?   Judgment: Judgment normal.  ?Vitals reviewed.  ? ?Assessment: Annual Exam ?1. Women's annual routine gynecological examination   ?2. CIN I (cervical intraepithelial neoplasia I)   ?3. Overactive bladder   ? ? ?Plan: ?           ?1.  Cervical Screening-  Pap smear done today ?CIN I last year (biopsy) ?ASCUS last fall ?Plan every 6 mos until normal pattern ensues ? ?2. Breast screening- Exam annually and mammogram (UTD) ? ?3. Colonoscopy every 10 years, Hemoccult testing after age 29 ? ?4. Labs managed by PCP ? ?5. Counseling for hormonal therapy: none ?Has never used HRT or estrogen vaginal therapy (prescribed but not used in past) ?Minimal vaginal dryness currently; no desire for therapy ? ?            6. FRAX - FRAX score for assessing the 10 year probability for fracture calculated and discussed today.  Based on age and score today, DEXA is not currently scheduled. ? ?  F/U ? Return in about 6 months (around 02/23/2022) for PAP follow up ABC. ? ?[name], MD, FACOG ?[facility], [facility] ?08/23/2021  9:06 AM ? ? ?

## 2021-08-25 LAB — CYTOLOGY - PAP: Diagnosis: UNDETERMINED — AB

## 2021-09-04 ENCOUNTER — Other Ambulatory Visit: Payer: Self-pay

## 2021-09-04 ENCOUNTER — Ambulatory Visit (INDEPENDENT_AMBULATORY_CARE_PROVIDER_SITE_OTHER): Payer: BC Managed Care – PPO | Admitting: Gastroenterology

## 2021-09-04 ENCOUNTER — Encounter: Payer: Self-pay | Admitting: Gastroenterology

## 2021-09-04 VITALS — BP 120/78 | HR 87 | Temp 98.3°F | Wt 165.0 lb

## 2021-09-04 DIAGNOSIS — K219 Gastro-esophageal reflux disease without esophagitis: Secondary | ICD-10-CM

## 2021-09-04 NOTE — Progress Notes (Signed)
? ? ?Primary Care Physician: Nadara Mustard, MD ? ?Primary Gastroenterologist:  Dr. Midge Minium ? ?Chief Complaint  ?Patient presents with  ? Gastroesophageal Reflux  ?  Pt is still taking leftover Dexilant, has not started Pantoprazole at this time   ? ? ?HPI: Misty Graham is a 56 y.o. female here for follow-up.  The patient has a long history of GERD and reports that she has been having good control under the Dexilant but it was recently told to her by her insurance coverage that she would need to go back on Protonix. She denies any unexplained weight loss fevers chills nausea vomiting black stools or bloody stools.  The patient's last colonoscopy was negative and she is not due for repeat colonoscopy until 2025.  The patient denies any abdominal pain at present time. ? ?Past Medical History:  ?Diagnosis Date  ? Depression   ? Diabetes mellitus without complication (HCC)   ? Family history of ovarian cancer   ? Pt is My Risk neg 5/16 except NBN VUS  ? GERD (gastroesophageal reflux disease)   ? Hypertension   ? Iron deficiency   ? ? ?Current Outpatient Medications  ?Medication Sig Dispense Refill  ? atorvastatin (LIPITOR) 40 MG tablet Take 40 mg by mouth daily.    ? cetirizine (ZYRTEC) 10 MG tablet Take 10 mg by mouth daily.    ? Continuous Blood Gluc Sensor (FREESTYLE LIBRE 14 DAY SENSOR) MISC Apply topically.    ? desvenlafaxine (PRISTIQ) 50 MG 24 hr tablet Take 50 mg by mouth daily.     ? glipiZIDE (GLUCOTROL XL) 5 MG 24 hr tablet Take 1 tablet (5 mg total) by mouth daily with breakfast. 30 tablet 0  ? glucose blood (ONETOUCH VERIO) test strip To check blood sugar once daily 100 each 5  ? hydrochlorothiazide (HYDRODIURIL) 12.5 MG tablet Take 12.5 mg by mouth daily.    ? JANUVIA 100 MG tablet TAKE 1 TABLET BY MOUTH EVERY DAY (Patient taking differently: Take 100 mg by mouth daily.) 90 tablet 1  ? lisinopril (ZESTRIL) 10 MG tablet Take 10 mg by mouth daily.    ? metFORMIN (GLUCOPHAGE-XR) 500 MG 24 hr tablet  Take 1,000 mg by mouth daily with breakfast.     ? ONETOUCH DELICA LANCETS FINE MISC To check blood sugar once a day DX:  E11.9 100 each 5  ? tolterodine (DETROL LA) 4 MG 24 hr capsule Take 1 capsule (4 mg total) by mouth daily. 90 capsule 3  ? pantoprazole (PROTONIX) 40 MG tablet Take 1 tablet (40 mg total) by mouth daily. (Patient not taking: Reported on 09/04/2021) 30 tablet 1  ? ?No current facility-administered medications for this visit.  ? ? ?Allergies as of 09/04/2021  ? (No Known Allergies)  ? ? ?ROS: ? ?General: Negative for anorexia, weight loss, fever, chills, fatigue, weakness. ?ENT: Negative for hoarseness, difficulty swallowing , nasal congestion. ?CV: Negative for chest pain, angina, palpitations, dyspnea on exertion, peripheral edema.  ?Respiratory: Negative for dyspnea at rest, dyspnea on exertion, cough, sputum, wheezing.  ?GI: See history of present illness. ?GU:  Negative for dysuria, hematuria, urinary incontinence, urinary frequency, nocturnal urination.  ?Endo: Negative for unusual weight change.  ?  ?Physical Examination: ? ? BP 120/78   Pulse 87   Temp 98.3 ?F (36.8 ?C) (Oral)   Wt 165 lb (74.8 kg)   LMP 07/18/2010   BMI 26.63 kg/m?  ? ?General: Well-nourished, well-developed in no acute distress.  ?Eyes: No  icterus. Conjunctivae pink. ?Lungs: Clear to auscultation bilaterally. Non-labored. ?Heart: Regular rate and rhythm, no murmurs rubs or gallops.  ?Abdomen: Bowel sounds are normal, nontender, nondistended, no hepatosplenomegaly or masses, no abdominal bruits or hernia , no rebound or guarding.   ?Extremities: No lower extremity edema. No clubbing or deformities. ?Neuro: Alert and oriented x 3.  Grossly intact. ?Skin: Warm and dry, no jaundice.   ?Psych: Alert and cooperative, normal mood and affect. ? ?Labs:  ?  ?Imaging Studies: ?No results found. ? ?Assessment and Plan:  ? ?Misty Graham is a 56 y.o. y/o female Who comes in today with a history of GERD and has been doing well on  her present medication but needs to be switched to Protonix due to Dexilant not being covered by her insurance currently.  The patient will let me know if she needs a refill of her prescription.  Otherwise she has been told she needs a repeat colonoscopy in 2025.  The patient has been explained the plan and agrees with it. ? ? ? ? ?Midge Minium, MD. Clementeen Graham ? ? ? Note: This dictation was prepared with Dragon dictation along with smaller phrase technology. Any transcriptional errors that result from this process are unintentional.  ?

## 2021-11-05 ENCOUNTER — Other Ambulatory Visit: Payer: Self-pay | Admitting: Gastroenterology

## 2021-11-09 ENCOUNTER — Telehealth: Payer: Self-pay | Admitting: Gastroenterology

## 2021-11-09 NOTE — Telephone Encounter (Signed)
Patient requests a call, has a question about a medication. (Pantoprazole)

## 2021-11-10 NOTE — Telephone Encounter (Signed)
Left message on voicemail.

## 2022-02-25 NOTE — Progress Notes (Unsigned)
Pcp, No   No chief complaint on file.   HPI:      [name] is a 56 y.o. [id number] whose LMP was Patient's last menstrual period was 07/18/2010., presents today for 6 mo repeat pap smear. ASCUS 6 mos ago; CIN I by Biopsy 12 mos ago;     Patient Active Problem List   Diagnosis Date Noted   CIN I (cervical intraepithelial neoplasia I) 01/09/2021   ASCUS with positive high risk HPV cervical 07/08/2020   Pneumonia due to COVID-19 virus 07/31/2019   COVID-19 virus infection 07/30/2019   Essential hypertension 07/30/2019   Elevated lactic acid level 07/30/2019   Diabetes mellitus type 2, controlled, without complications (HCC) 12/09/2017   Hypercholesterolemia 12/09/2017   Major depression in remission (HCC) 12/09/2017   Overactive bladder 11/14/2016   Neck pain on right side 10/21/2015   Diabetes mellitus (HCC) 09/06/2015   Adaptation reaction 07/18/2015   Allergic rhinitis 07/18/2015   Acid reflux 07/18/2015   Restless leg 07/18/2015   BP (high blood pressure) 04/04/2015   Iron deficiency anemia 01/05/2015   Cervical pain 10/29/2009    Past Surgical History:  Procedure Laterality Date   BREAST BIOPSY Right 2014   calcs with Sankar, benign, with clip   BREAST SURGERY Right    biopsy; Dr. Evette Cristal   CERVICAL DISCECTOMY  03/15/2010   CESAREAN SECTION  1991   COLONOSCOPY     LASIK     VAGINAL BIRTH AFTER CESAREAN SECTION  1993 and 1997    Family History  Problem Relation Age of Onset   Lung cancer Mother    COPD Father    Hypertension Father    Allergies Father    Coronary artery disease Father    Healthy Sister    Heart attack Paternal Grandmother    Ovarian cancer Paternal Grandmother    Heart attack Paternal Grandfather    Breast cancer Neg Hx     Social History   Socioeconomic History   Marital status: Divorced    Spouse name: Not on file   Number of children: Not on file   Years of education: Not on file   Highest education level: Not on  file  Occupational History   Not on file  Tobacco Use   Smoking status: Never   Smokeless tobacco: Never  Vaping Use   Vaping Use: Never used  Substance and Sexual Activity   Alcohol use: No   Drug use: No   Sexual activity: Yes  Other Topics Concern   Not on file  Social History Narrative   Not on file   Social Determinants of Health   Financial Resource Strain: Not on file  Food Insecurity: Not on file  Transportation Needs: Not on file  Physical Activity: Not on file  Stress: Not on file  Social Connections: Not on file  Intimate Partner Violence: Not on file    Outpatient Medications Prior to Visit  Medication Sig Dispense Refill   atorvastatin (LIPITOR) 40 MG tablet Take 40 mg by mouth daily.     cetirizine (ZYRTEC) 10 MG tablet Take 10 mg by mouth daily.     Continuous Blood Gluc Sensor (FREESTYLE LIBRE 14 DAY SENSOR) MISC Apply topically.     desvenlafaxine (PRISTIQ) 50 MG 24 hr tablet Take 50 mg by mouth daily.      glipiZIDE (GLUCOTROL XL) 5 MG 24 hr tablet Take 1 tablet (5 mg total) by mouth daily with breakfast. 30 tablet 0  glucose blood (ONETOUCH VERIO) test strip To check blood sugar once daily 100 each 5   hydrochlorothiazide (HYDRODIURIL) 12.5 MG tablet Take 12.5 mg by mouth daily.     JANUVIA 100 MG tablet TAKE 1 TABLET BY MOUTH EVERY DAY (Patient taking differently: Take 100 mg by mouth daily.) 90 tablet 1   lisinopril (ZESTRIL) 10 MG tablet Take 10 mg by mouth daily.     metFORMIN (GLUCOPHAGE-XR) 500 MG 24 hr tablet Take 1,000 mg by mouth daily with breakfast.      ONETOUCH DELICA LANCETS FINE MISC To check blood sugar once a day DX:  E11.9 100 each 5   pantoprazole (PROTONIX) 40 MG tablet TAKE 1 TABLET(40 MG) BY MOUTH DAILY 90 tablet 2   tolterodine (DETROL LA) 4 MG 24 hr capsule Take 1 capsule (4 mg total) by mouth daily. 90 capsule 3   No facility-administered medications prior to visit.      ROS:  Review of Systems BREAST: No  symptoms   OBJECTIVE:   Vitals:  LMP 07/18/2010   Physical Exam  Results: No results found for this or any previous visit (from the past 24 hour(s)).   Assessment/Plan: No diagnosis found.    No orders of the defined types were placed in this encounter.     No follow-ups on file.  Shadee Montoya B. Elsbeth Yearick, PA-C 02/25/2022 9:52 AM

## 2022-02-27 ENCOUNTER — Ambulatory Visit: Payer: BC Managed Care – PPO | Admitting: Obstetrics and Gynecology

## 2022-02-27 ENCOUNTER — Encounter: Payer: Self-pay | Admitting: Obstetrics and Gynecology

## 2022-02-27 ENCOUNTER — Other Ambulatory Visit (HOSPITAL_COMMUNITY)
Admission: RE | Admit: 2022-02-27 | Discharge: 2022-02-27 | Disposition: A | Discharge status: Home / Self Care | Payer: BC Managed Care – PPO | Source: Ambulatory Visit | Attending: Obstetrics and Gynecology | Admitting: Obstetrics and Gynecology

## 2022-02-27 VITALS — BP 110/70 | Ht 66.0 in | Wt 163.0 lb

## 2022-02-27 DIAGNOSIS — N870 Mild cervical dysplasia: Secondary | ICD-10-CM | POA: Insufficient documentation

## 2022-02-27 DIAGNOSIS — Z1151 Encounter for screening for human papillomavirus (HPV): Secondary | ICD-10-CM | POA: Insufficient documentation

## 2022-02-27 DIAGNOSIS — Z124 Encounter for screening for malignant neoplasm of cervix: Secondary | ICD-10-CM | POA: Insufficient documentation

## 2022-02-27 NOTE — Patient Instructions (Signed)
I value your feedback and you entrusting us with your care. If you get a [facility] patient survey, I would appreciate you taking the time to let us know about your experience today. Thank you! ? ? ?

## 2022-03-02 LAB — CYTOLOGY - PAP
Comment: NEGATIVE
Diagnosis: NEGATIVE
Diagnosis: REACTIVE
High risk HPV: NEGATIVE

## 2022-05-18 ENCOUNTER — Ambulatory Visit: Payer: BC Managed Care – PPO

## 2022-05-20 ENCOUNTER — Ambulatory Visit
Admission: RE | Admit: 2022-05-20 | Discharge: 2022-05-20 | Disposition: A | Discharge status: Home / Self Care | Payer: BC Managed Care – PPO | Source: Ambulatory Visit | Attending: Urgent Care | Admitting: Urgent Care

## 2022-05-20 VITALS — BP 132/84 | HR 98 | Temp 98.5°F | Resp 18

## 2022-05-20 DIAGNOSIS — R350 Frequency of micturition: Secondary | ICD-10-CM | POA: Insufficient documentation

## 2022-05-20 LAB — POCT URINALYSIS DIP (MANUAL ENTRY)
Bilirubin, UA: NEGATIVE
Blood, UA: NEGATIVE
Glucose, UA: 100 mg/dL — AB
Ketones, POC UA: NEGATIVE mg/dL
Nitrite, UA: NEGATIVE
Protein Ur, POC: NEGATIVE mg/dL
Spec Grav, UA: 1.02 (ref 1.010–1.025)
Urobilinogen, UA: 2 E.U./dL — AB
pH, UA: 7 (ref 5.0–8.0)

## 2022-05-20 NOTE — ED Triage Notes (Signed)
Pt reports odor, frequent urination which began Thursday. They are reoccuring. She did not take anything for it.

## 2022-05-20 NOTE — Discharge Instructions (Signed)

## 2022-05-20 NOTE — ED Provider Notes (Signed)
[facility]  Note:  This document was prepared using Conservation officer, historic buildings and may include unintentional dictation errors.  MRN: [id number] DOB: 07-21-1965  Subjective:   Misty Graham is a 56 y.o. female presenting for 3-day history of acute onset urinary frequency with a strong odor. Has a history of UTIs, has had several this year. Believes last episode was 02/2022.  Has previously taken ciprofloxacin or Macrobid.  Does not see an urologist. Denies fever, n/v, abdominal pain, pelvic pain, rashes, dysuria, hematuria, vaginal discharge.  Reports that she does not drink water, primarily drinks diet soda.  Has previously been advised to change this around but finds it difficult to do so.  No current facility-administered medications for this encounter.  Current Outpatient Medications:    atorvastatin (LIPITOR) 40 MG tablet, Take 40 mg by mouth daily., Disp: , Rfl:    cetirizine (ZYRTEC) 10 MG tablet, Take 10 mg by mouth daily., Disp: , Rfl:    Continuous Blood Gluc Sensor (FREESTYLE LIBRE 14 DAY SENSOR) MISC, Apply topically., Disp: , Rfl:    desvenlafaxine (PRISTIQ) 50 MG 24 hr tablet, Take 50 mg by mouth daily. , Disp: , Rfl:    glipiZIDE (GLUCOTROL XL) 5 MG 24 hr tablet, Take 1 tablet (5 mg total) by mouth daily with breakfast., Disp: 30 tablet, Rfl: 0   glucose blood (ONETOUCH VERIO) test strip, To check blood sugar once daily, Disp: 100 each, Rfl: 5   hydrochlorothiazide (HYDRODIURIL) 12.5 MG tablet, Take 12.5 mg by mouth daily., Disp: , Rfl:    JANUVIA 100 MG tablet, TAKE 1 TABLET BY MOUTH EVERY DAY (Patient taking differently: Take 100 mg by mouth daily.), Disp: 90 tablet, Rfl: 1   lisinopril (ZESTRIL) 10 MG tablet, Take 10 mg by mouth daily., Disp: , Rfl:    metFORMIN (GLUCOPHAGE-XR) 500 MG 24 hr tablet, Take 1,000 mg by mouth daily with breakfast. , Disp: , Rfl:    ONETOUCH DELICA LANCETS FINE MISC, To check blood sugar once a day DX:  E11.9,  Disp: 100 each, Rfl: 5   pantoprazole (PROTONIX) 40 MG tablet, TAKE 1 TABLET(40 MG) BY MOUTH DAILY, Disp: 90 tablet, Rfl: 2   tolterodine (DETROL LA) 4 MG 24 hr capsule, Take 1 capsule (4 mg total) by mouth daily., Disp: 90 capsule, Rfl: 3   No Known Allergies  Past Medical History:  Diagnosis Date   Depression    Diabetes mellitus without complication (HCC)    Family history of ovarian cancer    Pt is My Risk neg 5/16 except NBN VUS   GERD (gastroesophageal reflux disease)    Hypertension    Iron deficiency      Past Surgical History:  Procedure Laterality Date   BREAST BIOPSY Right 2014   calcs with Sankar, benign, with clip   BREAST SURGERY Right    biopsy; Dr. Evette Cristal   CERVICAL DISCECTOMY  03/15/2010   CESAREAN SECTION  1991   COLONOSCOPY     LASIK     VAGINAL BIRTH AFTER CESAREAN SECTION  1993 and 1997    Family History  Problem Relation Age of Onset   Lung cancer Mother    COPD Father    Hypertension Father    Allergies Father    Coronary artery disease Father    Healthy Sister    Heart attack Paternal Grandmother    Ovarian cancer Paternal Grandmother    Heart attack Paternal Grandfather    Breast cancer Neg Hx  Social History   Tobacco Use   Smoking status: Never   Smokeless tobacco: Never  Vaping Use   Vaping Use: Never used  Substance Use Topics   Alcohol use: No   Drug use: No    ROS   Objective:   Vitals: BP 132/84 (BP Location: Right Arm)   Pulse 98   Temp 98.5 F (36.9 C) (Oral)   Resp 18   LMP 07/18/2010   SpO2 95%   Physical Exam Constitutional:      General: She is not in acute distress.    Appearance: Normal appearance. She is well-developed. She is not ill-appearing, toxic-appearing or diaphoretic.  HENT:     Head: Normocephalic and atraumatic.     Nose: Nose normal.     Mouth/Throat:     Mouth: Mucous membranes are moist.  Eyes:     General: No scleral icterus.       Right eye: No discharge.        Left eye: No  discharge.     Extraocular Movements: Extraocular movements intact.     Conjunctiva/sclera: Conjunctivae normal.  Cardiovascular:     Rate and Rhythm: Normal rate.  Pulmonary:     Effort: Pulmonary effort is normal.  Abdominal:     General: Bowel sounds are normal. There is no distension.     Palpations: Abdomen is soft. There is no mass.     Tenderness: There is no abdominal tenderness. There is no right CVA tenderness, left CVA tenderness, guarding or rebound.  Skin:    General: Skin is warm and dry.  Neurological:     General: No focal deficit present.     Mental Status: She is alert and oriented to person, place, and time.  Psychiatric:        Mood and Affect: Mood normal.        Behavior: Behavior normal.        Thought Content: Thought content normal.        Judgment: Judgment normal.     Results for orders placed or performed during the hospital encounter of 05/20/22 (from the past 24 hour(s))  POCT urinalysis dipstick     Status: Abnormal   Collection Time: 05/20/22  2:12 PM  Result Value Ref Range   Color, UA light yellow (A) yellow   Clarity, UA clear clear   Glucose, UA =100 (A) negative mg/dL   Bilirubin, UA negative negative   Ketones, POC UA negative negative mg/dL   Spec Grav, UA 5.809 9.833 - 1.025   Blood, UA negative negative   pH, UA 7.0 5.0 - 8.0   Protein Ur, POC negative negative mg/dL   Urobilinogen, UA 2.0 (A) 0.2 or 1.0 E.U./dL   Nitrite, UA Negative Negative   Leukocytes, UA Small (1+) (A) Negative    Assessment and Plan :   PDMP not reviewed this encounter.  1. Urinary frequency     Low suspicion for an acute cystitis.  We will recommend treatment based off the urine culture.  In the meantime emphasized need to start hydrating with water and eliminate diet soda altogether. Counseled patient on potential for adverse effects with medications prescribed/recommended today, ER and return-to-clinic precautions discussed, patient verbalized  understanding.    Wallis Bamberg, PA-C 05/20/22 1443

## 2022-05-22 ENCOUNTER — Telehealth (HOSPITAL_COMMUNITY): Payer: Self-pay | Admitting: Emergency Medicine

## 2022-05-22 LAB — URINE CULTURE: Culture: >=100000 — AB

## 2022-05-22 MED ORDER — NITROFURANTOIN MONOHYD MACRO 100 MG PO CAPS: 100.0000 mg | ORAL_CAPSULE | Freq: Two times a day (BID) | ORAL | 0 refills | Status: DC

## 2022-06-01 ENCOUNTER — Ambulatory Visit
Admission: EM | Admit: 2022-06-01 | Discharge: 2022-06-01 | Disposition: A | Discharge status: Home / Self Care | Payer: BC Managed Care – PPO

## 2022-06-01 DIAGNOSIS — H6691 Otitis media, unspecified, right ear: Secondary | ICD-10-CM

## 2022-06-01 DIAGNOSIS — B9789 Other viral agents as the cause of diseases classified elsewhere: Secondary | ICD-10-CM

## 2022-06-01 MED ORDER — AMOXICILLIN-POT CLAVULANATE 875-125 MG PO TABS: 1.0000 | ORAL_TABLET | Freq: Two times a day (BID) | ORAL | 0 refills | Status: DC

## 2022-06-01 NOTE — ED Triage Notes (Signed)
Pt. Presents to UC w/ c.o a cough, right ear pain, a sore throat and congestion.

## 2022-06-01 NOTE — Discharge Instructions (Signed)
You have been diagnosed with a viral upper respiratory infection based on your symptoms and exam with a secondary bacterial middle ear infection. An antibiotic has been prescribed to treat the ear infection. Also, make sure you get plenty of rest and non-caffeinated fluids.  We recommend you use over-the-counter medications for symptom control including Tylenol or ibuprofen for fever, chills or body aches, and cold/cough medication.  Saline mist spray is helpful for removing excess mucus from your nose.  Room humidifiers are helpful to ease breathing at night.   You might also find relief of nasal/sinus congestion symptoms by using a nasal decongestant such as Sudafed sinus (pseudoephedrine).  You will need to obtain this medication from behind the pharmacist counter.  Speak to the pharmacist to verify that you are not duplicating medications with other over-the-counter formulations that you may be using.   Follow up here or with your primary care provider if your symptoms are worsening or not improving.

## 2022-06-01 NOTE — ED Provider Notes (Signed)
Misty Graham    CSN: [id number] Arrival date & time: 06/01/22  1028      History   Chief Complaint Chief Complaint  Patient presents with   Sore Throat   Cough   Nasal Congestion   Otalgia    HPI Misty Graham is a 56 y.o. female.    Sore Throat  Cough Associated symptoms: ear pain   Otalgia Associated symptoms: cough     Presents to urgent care with complaint of cough, right ear pain, sore throat, congestion since Tuesday (3 days). Denies fever.  Past Medical History:  Diagnosis Date   Depression    Diabetes mellitus without complication (HCC)    Family history of ovarian cancer    Pt is My Risk neg 5/16 except NBN VUS   GERD (gastroesophageal reflux disease)    Hypertension    Iron deficiency     Patient Active Problem List   Diagnosis Date Noted   CIN I (cervical intraepithelial neoplasia I) 01/09/2021   ASCUS with positive high risk HPV cervical 07/08/2020   Pneumonia due to COVID-19 virus 07/31/2019   COVID-19 virus infection 07/30/2019   Essential hypertension 07/30/2019   Elevated lactic acid level 07/30/2019   Diabetes mellitus type 2, controlled, without complications (HCC) 12/09/2017   Hypercholesterolemia 12/09/2017   Major depression in remission (HCC) 12/09/2017   Overactive bladder 11/14/2016   Neck pain on right side 10/21/2015   Diabetes mellitus (HCC) 09/06/2015   Adaptation reaction 07/18/2015   Allergic rhinitis 07/18/2015   Acid reflux 07/18/2015   Restless leg 07/18/2015   BP (high blood pressure) 04/04/2015   Iron deficiency anemia 01/05/2015   Cervical pain 10/29/2009    Past Surgical History:  Procedure Laterality Date   BREAST BIOPSY Right 2014   calcs with Sankar, benign, with clip   BREAST SURGERY Right    biopsy; Dr. Evette Cristal   CERVICAL DISCECTOMY  03/15/2010   CESAREAN SECTION  1991   COLONOSCOPY     LASIK     VAGINAL BIRTH AFTER CESAREAN SECTION  1993 and 1997    OB History     Gravida  3   Para   3   Term  3   Preterm      AB      Living  3      SAB      IAB      Ectopic      Multiple      Live Births               Home Medications    Prior to Admission medications   Medication Sig Start Date End Date Taking? Authorizing Provider  atorvastatin (LIPITOR) 40 MG tablet Take 40 mg by mouth daily. 03/17/19   [provider]  cetirizine (ZYRTEC) 10 MG tablet Take 10 mg by mouth daily.    [provider]  Continuous Blood Gluc Sensor (FREESTYLE LIBRE 14 DAY SENSOR) MISC Apply topically. 06/06/20   [provider]  desvenlafaxine (PRISTIQ) 50 MG 24 hr tablet Take 50 mg by mouth daily.  11/20/18   [provider]  Dulaglutide 3 MG/0.5ML SOPN Inject into the skin. 06/25/22   [provider]  glipiZIDE (GLUCOTROL XL) 5 MG 24 hr tablet Take 1 tablet (5 mg total) by mouth daily with breakfast. 04/17/18   Maple Hudson., MD  glucose blood [facility] VERIO) test strip To check blood sugar once daily 10/17/16   Margaretann Loveless,  PA-C  hydrochlorothiazide (HYDRODIURIL) 12.5 MG tablet Take 12.5 mg by mouth daily. 05/13/19   [provider]  JANUVIA 100 MG tablet TAKE 1 TABLET BY MOUTH EVERY DAY Patient taking differently: Take 100 mg by mouth daily. 02/26/18   Margaretann Loveless, PA-C  lisinopril (ZESTRIL) 10 MG tablet Take 10 mg by mouth daily. 05/13/19   [provider]  metFORMIN (GLUCOPHAGE-XR) 500 MG 24 hr tablet Take 1,000 mg by mouth daily with breakfast.  05/08/19   [provider]  nitrofurantoin, macrocrystal-monohydrate, (MACROBID) 100 MG capsule Take 1 capsule (100 mg total) by mouth 2 (two) times daily. 05/22/22   Lamptey, Britta Mccreedy, MD  [facility] DELICA LANCETS FINE MISC To check blood sugar once a day DX:  E11.9 07/29/15   Lorie Phenix, MD  pantoprazole (PROTONIX) 40 MG tablet TAKE 1 TABLET(40 MG) BY MOUTH DAILY 11/09/21   Midge Minium, MD  tolterodine (DETROL LA) 4 MG 24 hr capsule Take 1  capsule (4 mg total) by mouth daily. 08/23/21   Nadara Mustard, MD  TRULICITY 0.75 MG/0.5ML SOPN Inject 0.75 mg into the skin once a week.    [provider]    Family History Family History  Problem Relation Age of Onset   Lung cancer Mother    COPD Father    Hypertension Father    Allergies Father    Coronary artery disease Father    Healthy Sister    Heart attack Paternal Grandmother    Ovarian cancer Paternal Grandmother    Heart attack Paternal Grandfather    Breast cancer Neg Hx     Social History Social History   Tobacco Use   Smoking status: Never   Smokeless tobacco: Never  Vaping Use   Vaping Use: Never used  Substance Use Topics   Alcohol use: No   Drug use: No     Allergies   Patient has no known allergies.   Review of Systems Review of Systems  HENT:  Positive for ear pain.   Respiratory:  Positive for cough.      Physical Exam Triage Vital Signs ED Triage Vitals  Enc Vitals Group     BP 06/01/22 1106 (!) 148/81     Pulse Rate 06/01/22 1106 97     Resp 06/01/22 1106 18     Temp 06/01/22 1106 98.8 F (37.1 C)     Temp Source 06/01/22 1106 Oral     SpO2 06/01/22 1106 96 %     Weight --      Height --      Head Circumference --      Peak Flow --      Pain Score 06/01/22 1124 0     Pain Loc --      Pain Edu? --      Excl. in GC? --    No data found.  Updated Vital Signs BP (!) 148/81 (BP Location: Left Arm)   Pulse 97   Temp 98.8 F (37.1 C) (Oral)   Resp 18   LMP 07/18/2010   SpO2 96%   Visual Acuity Right Eye Distance:   Left Eye Distance:   Bilateral Distance:    Right Eye Near:   Left Eye Near:    Bilateral Near:     Physical Exam Vitals reviewed.  Constitutional:      Appearance: She is well-developed.  HENT:     Right Ear: A middle ear effusion is present. Tympanic membrane is erythematous.  Nose: Congestion present.     Mouth/Throat:     Pharynx: No oropharyngeal exudate or posterior oropharyngeal  erythema.     Tonsils: No tonsillar exudate.  Skin:    General: Skin is warm and dry.  Neurological:     General: No focal deficit present.     Mental Status: She is alert and oriented to person, place, and time.  Psychiatric:        Mood and Affect: Mood normal.        Behavior: Behavior normal.      UC Treatments / Results  Labs (all labs ordered are listed, but only abnormal results are displayed) Labs Reviewed - No data to display  EKG   Radiology No results found.  Procedures Procedures (including critical care time)  Medications Ordered in UC Medications - No data to display  Initial Impression / Assessment and Plan / UC Course  I have reviewed the triage vital signs and the nursing notes.  Pertinent labs & imaging results that were available during my care of the patient were reviewed by me and considered in my medical decision making (see chart for details).   Patient is afebrile here without recent antipyretics. Satting well on room air. Overall is well appearing, well hydrated, without respiratory distress. Pulmonary exam is unremarkable.  Right TM appears erythematous with some middle ear effusion.  Will treat for right acute otitis media.  Also suggested continued use of OTC medication for relief of likely viral sinusitis.    Final Clinical Impressions(s) / UC Diagnoses   Final diagnoses:  None   Discharge Instructions   None    ED Prescriptions   None    PDMP not reviewed this encounter.   Charma Igo, [place] 06/01/22 1153

## 2022-06-03 ENCOUNTER — Other Ambulatory Visit: Payer: Self-pay | Admitting: Gastroenterology

## 2022-06-05 ENCOUNTER — Telehealth: Payer: BC Managed Care – PPO | Admitting: Physician Assistant

## 2022-06-05 DIAGNOSIS — J019 Acute sinusitis, unspecified: Secondary | ICD-10-CM | POA: Diagnosis not present

## 2022-06-05 DIAGNOSIS — R051 Acute cough: Secondary | ICD-10-CM

## 2022-06-05 DIAGNOSIS — B9689 Other specified bacterial agents as the cause of diseases classified elsewhere: Secondary | ICD-10-CM | POA: Diagnosis not present

## 2022-06-05 DIAGNOSIS — H66001 Acute suppurative otitis media without spontaneous rupture of ear drum, right ear: Secondary | ICD-10-CM

## 2022-06-05 MED ORDER — AMOXICILLIN-POT CLAVULANATE 875-125 MG PO TABS: 1.0000 | ORAL_TABLET | Freq: Two times a day (BID) | ORAL | 0 refills | Status: DC

## 2022-06-05 MED ORDER — BENZONATATE 100 MG PO CAPS: 100.0000 mg | ORAL_CAPSULE | Freq: Three times a day (TID) | ORAL | 0 refills | Status: DC | PRN

## 2022-06-05 MED ORDER — PREDNISONE 10 MG (21) PO TBPK: ORAL_TABLET | ORAL | 0 refills | Status: DC

## 2022-06-05 NOTE — Patient Instructions (Signed)
Standley Dakins, thank you for joining Margaretann Loveless, PA-C for today's virtual visit.  While this provider is not your primary care provider (PCP), if your PCP is located in our provider database this encounter information will be shared with them immediately following your visit.   A [facility] MyChart account gives you access to today's visit and all your visits, tests, and labs performed at [facility] " click here if you don't have a [facility] MyChart account or go to [email or web address]  Consent: (Patient) Misty Graham provided verbal consent for this virtual visit at the beginning of the encounter.  Current Medications:  Current Outpatient Medications:    amoxicillin-clavulanate (AUGMENTIN) 875-125 MG tablet, Take 1 tablet by mouth 2 (two) times daily., Disp: 6 tablet, Rfl: 0   benzonatate (TESSALON) 100 MG capsule, Take 1 capsule (100 mg total) by mouth 3 (three) times daily as needed., Disp: 30 capsule, Rfl: 0   predniSONE (STERAPRED UNI-PAK 21 TAB) 10 MG (21) TBPK tablet, 6 day taper; take as directed on package instructions, Disp: 21 tablet, Rfl: 0   atorvastatin (LIPITOR) 40 MG tablet, Take 40 mg by mouth daily., Disp: , Rfl:    cetirizine (ZYRTEC) 10 MG tablet, Take 10 mg by mouth daily., Disp: , Rfl:    Continuous Blood Gluc Sensor (FREESTYLE LIBRE 14 DAY SENSOR) MISC, Apply topically., Disp: , Rfl:    desvenlafaxine (PRISTIQ) 50 MG 24 hr tablet, Take 50 mg by mouth daily. , Disp: , Rfl:    [START ON 06/25/2022] Dulaglutide 3 MG/0.5ML SOPN, Inject into the skin., Disp: , Rfl:    glipiZIDE (GLUCOTROL XL) 5 MG 24 hr tablet, Take 1 tablet (5 mg total) by mouth daily with breakfast., Disp: 30 tablet, Rfl: 0   glucose blood (ONETOUCH VERIO) test strip, To check blood sugar once daily, Disp: 100 each, Rfl: 5   hydrochlorothiazide (HYDRODIURIL) 12.5 MG tablet, Take 12.5 mg by mouth daily., Disp: , Rfl:    JANUVIA 100 MG tablet, TAKE 1 TABLET BY MOUTH EVERY DAY  (Patient taking differently: Take 100 mg by mouth daily.), Disp: 90 tablet, Rfl: 1   lisinopril (ZESTRIL) 10 MG tablet, Take 10 mg by mouth daily., Disp: , Rfl:    metFORMIN (GLUCOPHAGE-XR) 500 MG 24 hr tablet, Take 1,000 mg by mouth daily with breakfast. , Disp: , Rfl:    nitrofurantoin, macrocrystal-monohydrate, (MACROBID) 100 MG capsule, Take 1 capsule (100 mg total) by mouth 2 (two) times daily., Disp: 10 capsule, Rfl: 0   ONETOUCH DELICA LANCETS FINE MISC, To check blood sugar once a day DX:  E11.9, Disp: 100 each, Rfl: 5   pantoprazole (PROTONIX) 40 MG tablet, TAKE 1 TABLET(40 MG) BY MOUTH DAILY, Disp: 90 tablet, Rfl: 0   tolterodine (DETROL LA) 4 MG 24 hr capsule, Take 1 capsule (4 mg total) by mouth daily., Disp: 90 capsule, Rfl: 3   TRULICITY 0.75 MG/0.5ML SOPN, Inject 0.75 mg into the skin once a week., Disp: , Rfl:    Medications ordered in this encounter:  Meds ordered this encounter  Medications   amoxicillin-clavulanate (AUGMENTIN) 875-125 MG tablet    Sig: Take 1 tablet by mouth 2 (two) times daily.    Dispense:  6 tablet    Refill:  0    Order Specific Question:   Supervising Provider    Answer:   Merrilee Jansky [[id number]]   predniSONE (STERAPRED UNI-PAK 21 TAB) 10 MG (21) TBPK tablet    Sig: 6 day taper;  take as directed on package instructions    Dispense:  21 tablet    Refill:  0    Order Specific Question:   Supervising Provider    Answer:   Merrilee Jansky [[id number]]   benzonatate (TESSALON) 100 MG capsule    Sig: Take 1 capsule (100 mg total) by mouth 3 (three) times daily as needed.    Dispense:  30 capsule    Refill:  0    Order Specific Question:   Supervising Provider    Answer:   Merrilee Jansky [id number]     *If you need refills on other medications prior to your next appointment, please contact your pharmacy*  Follow-Up: Call back or seek an in-person evaluation if the symptoms worsen or if the condition fails to improve as anticipated.  [facility] [phone]  Other Instructions  Otitis Media, Adult  Otitis media occurs when there is inflammation and fluid in the middle ear with signs and symptoms of an acute infection. The middle ear is a part of the ear that contains bones for hearing as well as air that helps send sounds to the brain. When infected fluid builds up in this space, it causes pressure and can lead to an ear infection. The eustachian tube connects the middle ear to the back of the nose (nasopharynx) and normally allows air into the middle ear. If the eustachian tube becomes blocked, fluid can build up and become infected. What are the causes? This condition is caused by a blockage in the eustachian tube. This can be caused by mucus or by swelling of the tube. Problems that can cause a blockage include: A cold or other upper respiratory infection. Allergies. An irritant, such as tobacco smoke. Enlarged adenoids. The adenoids are areas of soft tissue located high in the back of the throat, behind the nose and the roof of the mouth. They are part of the body's defense system (immune system). A mass in the nasopharynx. Damage to the ear caused by pressure changes (barotrauma). What increases the risk? You are more likely to develop this condition if you: Smoke or are exposed to tobacco smoke. Have an opening in the roof of your mouth (cleft palate). Have gastroesophageal reflux. Have an immune system disorder. What are the signs or symptoms? Symptoms of this condition include: Ear pain. Fever. Decreased hearing. Tiredness (lethargy). Fluid leaking from the ear, if the eardrum is ruptured or has burst. Ringing in the ear. How is this diagnosed?  This condition is diagnosed with a physical exam. During the exam, your health care provider will use an instrument called an otoscope to look in your ear and check for redness, swelling, and fluid. He or she will also ask about your symptoms. Your  health care provider may also order tests, such as: A pneumatic otoscopy. This is a test to check the movement of the eardrum. It is done by squeezing a small amount of air into the ear. A tympanogram. This is a test that shows how well the eardrum moves in response to air pressure in the ear canal. It provides a graph for your health care provider to review. How is this treated? This condition can go away on its own within 3-5 days. But if the condition is caused by a bacterial infection and does not go away on its own, or if it keeps coming back, your health care provider may: Prescribe antibiotic medicine to treat the infection. Prescribe or recommend  medicines to control pain. Follow these instructions at home: Take over-the-counter and prescription medicines only as told by your health care provider. If you were prescribed an antibiotic medicine, take it as told by your health care provider. Do not stop taking the antibiotic even if you start to feel better. Keep all follow-up visits. This is important. Contact a health care provider if: You have bleeding from your nose. There is a lump on your neck. You are not feeling better in 5 days. You feel worse instead of better. Get help right away if: You have severe pain that is not controlled with medicine. You have swelling, redness, or pain around your ear. You have stiffness in your neck. A part of your face is not moving (paralyzed). The bone behind your ear (mastoid bone) is tender when you touch it. You develop a severe headache. Summary Otitis media is redness, soreness, and swelling of the middle ear, usually resulting in pain and decreased hearing. This condition can go away on its own within 3-5 days. If the problem does not go away in 3-5 days, your health care provider may give you medicines to treat the infection. If you were prescribed an antibiotic medicine, take it as told by your health care provider. Follow all  instructions that were given to you by your health care provider. This information is not intended to replace advice given to you by your health care provider. Make sure you discuss any questions you have with your health care provider. Document Revised: 09/19/2020 Document Reviewed: 09/19/2020 Elsevier Patient Education  2023 Elsevier Inc.   Sinus Infection, Adult A sinus infection, also called sinusitis, is inflammation of your sinuses. Sinuses are hollow spaces in the bones around your face. Your sinuses are located: Around your eyes. In the middle of your forehead. Behind your nose. In your cheekbones. Mucus normally drains out of your sinuses. When your nasal tissues become inflamed or swollen, mucus can become trapped or blocked. This allows bacteria, viruses, and fungi to grow, which leads to infection. Most infections of the sinuses are caused by a virus. A sinus infection can develop quickly. It can last for up to 4 weeks (acute) or for more than 12 weeks (chronic). A sinus infection often develops after a cold. What are the causes? This condition is caused by anything that creates swelling in the sinuses or stops mucus from draining. This includes: Allergies. Asthma. Infection from bacteria or viruses. Deformities or blockages in your nose or sinuses. Abnormal growths in the nose (nasal polyps). Pollutants, such as chemicals or irritants in the air. Infection from fungi. This is rare. What increases the risk? You are more likely to develop this condition if you: Have a weak body defense system (immune system). Do a lot of swimming or diving. Overuse nasal sprays. Smoke. What are the signs or symptoms? The main symptoms of this condition are pain and a feeling of pressure around the affected sinuses. Other symptoms include: Stuffy nose or congestion that makes it difficult to breathe through your nose. Thick yellow or greenish drainage from your nose. Tenderness, swelling,  and warmth over the affected sinuses. A cough that may get worse at night. Decreased sense of smell and taste. Extra mucus that collects in the throat or the back of the nose (postnasal drip) causing a sore throat or bad breath. Tiredness (fatigue). Fever. How is this diagnosed? This condition is diagnosed based on: Your symptoms. Your medical history. A physical exam. Tests to find out  if your condition is acute or chronic. This may include: Checking your nose for nasal polyps. Viewing your sinuses using a device that has a light (endoscope). Testing for allergies or bacteria. Imaging tests, such as an MRI or CT scan. In rare cases, a bone biopsy may be done to rule out more serious types of fungal sinus disease. How is this treated? Treatment for a sinus infection depends on the cause and whether your condition is chronic or acute. If caused by a virus, your symptoms should go away on their own within 10 days. You may be given medicines to relieve symptoms. They include: Medicines that shrink swollen nasal passages (decongestants). A spray that eases inflammation of the nostrils (topical intranasal corticosteroids). Rinses that help get rid of thick mucus in your nose (nasal saline washes). Medicines that treat allergies (antihistamines). Over-the-counter pain relievers. If caused by bacteria, your health care provider may recommend waiting to see if your symptoms improve. Most bacterial infections will get better without antibiotic medicine. You may be given antibiotics if you have: A severe infection. A weak immune system. If caused by narrow nasal passages or nasal polyps, surgery may be needed. Follow these instructions at home: Medicines Take, use, or apply over-the-counter and prescription medicines only as told by your health care provider. These may include nasal sprays. If you were prescribed an antibiotic medicine, take it as told by your health care provider. Do not stop  taking the antibiotic even if you start to feel better. Hydrate and humidify  Drink enough fluid to keep your urine pale yellow. Staying hydrated will help to thin your mucus. Use a cool mist humidifier to keep the humidity level in your home above 50%. Inhale steam for 10-15 minutes, 3-4 times a day, or as told by your health care provider. You can do this in the bathroom while a hot shower is running. Limit your exposure to cool or dry air. Rest Rest as much as possible. Sleep with your head raised (elevated). Make sure you get enough sleep each night. General instructions  Apply a warm, moist washcloth to your face 3-4 times a day or as told by your health care provider. This will help with discomfort. Use nasal saline washes as often as told by your health care provider. Wash your hands often with soap and water to reduce your exposure to germs. If soap and water are not available, use hand sanitizer. Do not smoke. Avoid being around people who are smoking (secondhand smoke). Keep all follow-up visits. This is important. Contact a health care provider if: You have a fever. Your symptoms get worse. Your symptoms do not improve within 10 days. Get help right away if: You have a severe headache. You have persistent vomiting. You have severe pain or swelling around your face or eyes. You have vision problems. You develop confusion. Your neck is stiff. You have trouble breathing. These symptoms may be an emergency. Get help right away. Call 911. Do not wait to see if the symptoms will go away. Do not drive yourself to the hospital. Summary A sinus infection is soreness and inflammation of your sinuses. Sinuses are hollow spaces in the bones around your face. This condition is caused by nasal tissues that become inflamed or swollen. The swelling traps or blocks the flow of mucus. This allows bacteria, viruses, and fungi to grow, which leads to infection. If you were prescribed an  antibiotic medicine, take it as told by your health care provider. Do  not stop taking the antibiotic even if you start to feel better. Keep all follow-up visits. This is important. This information is not intended to replace advice given to you by your health care provider. Make sure you discuss any questions you have with your health care provider. Document Revised: 05/16/2021 Document Reviewed: 05/16/2021 Elsevier Patient Education  2023 Elsevier Inc.    If you have been instructed to have an in-person evaluation today at a local Urgent Care facility, please use the link below. It will take you to a list of all of our available [facility], including address, phone number and hours of operation. Please do not delay care.  [facility]  If you or a family member do not have a primary care provider, use the link below to schedule a visit and establish care. When you choose a [facility] primary care physician or advanced practice provider, you gain a long-term partner in health. Find a Primary Care Provider  Learn more about [facility]'s in-office and virtual care options: [facility] - Get Care Now

## 2022-06-05 NOTE — Progress Notes (Signed)
Virtual Visit Consent   Misty Graham, you are scheduled for a virtual visit with a [facility] provider today. Just as with appointments in the office, your consent must be obtained to participate. Your consent will be active for this visit and any virtual visit you may have with one of our providers in the next 365 days. If you have a MyChart account, a copy of this consent can be sent to you electronically.  As this is a virtual visit, video technology does not allow for your provider to perform a traditional examination. This may limit your provider's ability to fully assess your condition. If your provider identifies any concerns that need to be evaluated in person or the need to arrange testing (such as labs, EKG, etc.), we will make arrangements to do so. Although advances in technology are sophisticated, we cannot ensure that it will always work on either your end or our end. If the connection with a video visit is poor, the visit may have to be switched to a telephone visit. With either a video or telephone visit, we are not always able to ensure that we have a secure connection.  By engaging in this virtual visit, you consent to the provision of healthcare and authorize for your insurance to be billed (if applicable) for the services provided during this visit. Depending on your insurance coverage, you may receive a charge related to this service.  I need to obtain your verbal consent now. Are you willing to proceed with your visit today? Misty Graham has provided verbal consent on 06/05/2022 for a virtual visit (video or telephone). Margaretann Loveless, PA-C  Date: 06/05/2022 11:57 AM  Virtual Visit via Video Note   I, Margaretann Loveless, connected with  Misty Graham  ([id number], 11-22-1965) on 06/05/22 at 12:00 PM EST by a video-enabled telemedicine application and verified that I am speaking with the correct person using two identifiers.  Location: Patient: Virtual Visit Location  Patient: Other: work isolated Provider: Engineer, mining Provider: Home Office   I discussed the limitations of evaluation and management by telemedicine and the availability of in person appointments. The patient expressed understanding and agreed to proceed.    History of Present Illness: Misty Graham is a 56 y.o. who identifies as a female who was assigned female at birth, and is being seen today for otalgia.  HPI: Otalgia  There is pain in the right ear. This is a new problem. The current episode started 1 to 4 weeks ago (Seen at [facility] on 06/01/22 and started on Augmentin for sinus infection, given 5 days). The problem occurs constantly. The problem has been unchanged. There has been no fever. The pain is moderate. Associated symptoms include coughing, headaches, hearing loss (and tinnitus, right) and rhinorrhea. Pertinent negatives include no ear discharge, neck pain, sore throat or vomiting. Associated symptoms comments: Sinus pain. She has tried NSAIDs and acetaminophen (sudafed, augmentin) for the symptoms. The treatment provided no relief. There is no history of a chronic ear infection, hearing loss or a tympanostomy tube.     Problems:  Patient Active Problem List   Diagnosis Date Noted   CIN I (cervical intraepithelial neoplasia I) 01/09/2021   ASCUS with positive high risk HPV cervical 07/08/2020   Pneumonia due to COVID-19 virus 07/31/2019   COVID-19 virus infection 07/30/2019   Essential hypertension 07/30/2019   Elevated lactic acid level 07/30/2019   Diabetes mellitus type 2, controlled, without complications (HCC) 12/09/2017  Hypercholesterolemia 12/09/2017   Major depression in remission (HCC) 12/09/2017   Overactive bladder 11/14/2016   Neck pain on right side 10/21/2015   Diabetes mellitus (HCC) 09/06/2015   Adaptation reaction 07/18/2015   Allergic rhinitis 07/18/2015   Acid reflux 07/18/2015   Restless leg 07/18/2015   BP (high blood pressure) 04/04/2015    Iron deficiency anemia 01/05/2015   Cervical pain 10/29/2009    Allergies: No Known Allergies Medications:  Current Outpatient Medications:    amoxicillin-clavulanate (AUGMENTIN) 875-125 MG tablet, Take 1 tablet by mouth 2 (two) times daily., Disp: 6 tablet, Rfl: 0   benzonatate (TESSALON) 100 MG capsule, Take 1 capsule (100 mg total) by mouth 3 (three) times daily as needed., Disp: 30 capsule, Rfl: 0   predniSONE (STERAPRED UNI-PAK 21 TAB) 10 MG (21) TBPK tablet, 6 day taper; take as directed on package instructions, Disp: 21 tablet, Rfl: 0   atorvastatin (LIPITOR) 40 MG tablet, Take 40 mg by mouth daily., Disp: , Rfl:    cetirizine (ZYRTEC) 10 MG tablet, Take 10 mg by mouth daily., Disp: , Rfl:    Continuous Blood Gluc Sensor (FREESTYLE LIBRE 14 DAY SENSOR) MISC, Apply topically., Disp: , Rfl:    desvenlafaxine (PRISTIQ) 50 MG 24 hr tablet, Take 50 mg by mouth daily. , Disp: , Rfl:    [START ON 06/25/2022] Dulaglutide 3 MG/0.5ML SOPN, Inject into the skin., Disp: , Rfl:    glipiZIDE (GLUCOTROL XL) 5 MG 24 hr tablet, Take 1 tablet (5 mg total) by mouth daily with breakfast., Disp: 30 tablet, Rfl: 0   glucose blood (ONETOUCH VERIO) test strip, To check blood sugar once daily, Disp: 100 each, Rfl: 5   hydrochlorothiazide (HYDRODIURIL) 12.5 MG tablet, Take 12.5 mg by mouth daily., Disp: , Rfl:    JANUVIA 100 MG tablet, TAKE 1 TABLET BY MOUTH EVERY DAY (Patient taking differently: Take 100 mg by mouth daily.), Disp: 90 tablet, Rfl: 1   lisinopril (ZESTRIL) 10 MG tablet, Take 10 mg by mouth daily., Disp: , Rfl:    metFORMIN (GLUCOPHAGE-XR) 500 MG 24 hr tablet, Take 1,000 mg by mouth daily with breakfast. , Disp: , Rfl:    nitrofurantoin, macrocrystal-monohydrate, (MACROBID) 100 MG capsule, Take 1 capsule (100 mg total) by mouth 2 (two) times daily., Disp: 10 capsule, Rfl: 0   ONETOUCH DELICA LANCETS FINE MISC, To check blood sugar once a day DX:  E11.9, Disp: 100 each, Rfl: 5   pantoprazole  (PROTONIX) 40 MG tablet, TAKE 1 TABLET(40 MG) BY MOUTH DAILY, Disp: 90 tablet, Rfl: 0   tolterodine (DETROL LA) 4 MG 24 hr capsule, Take 1 capsule (4 mg total) by mouth daily., Disp: 90 capsule, Rfl: 3   TRULICITY 0.75 MG/0.5ML SOPN, Inject 0.75 mg into the skin once a week., Disp: , Rfl:   Observations/Objective: Patient is well-developed, well-nourished in no acute distress.  Resting comfortably  Head is normocephalic, atraumatic.  No labored breathing.  Speech is clear and coherent with logical content.  Patient is alert and oriented at baseline.    Assessment and Plan: 1. Acute bacterial sinusitis - amoxicillin-clavulanate (AUGMENTIN) 875-125 MG tablet; Take 1 tablet by mouth 2 (two) times daily.  Dispense: 6 tablet; Refill: 0 - predniSONE (STERAPRED UNI-PAK 21 TAB) 10 MG (21) TBPK tablet; 6 day taper; take as directed on package instructions  Dispense: 21 tablet; Refill: 0  2. Non-recurrent acute suppurative otitis media of right ear without spontaneous rupture of tympanic membrane - amoxicillin-clavulanate (AUGMENTIN) 875-125 MG tablet; Take  1 tablet by mouth 2 (two) times daily.  Dispense: 6 tablet; Refill: 0 - predniSONE (STERAPRED UNI-PAK 21 TAB) 10 MG (21) TBPK tablet; 6 day taper; take as directed on package instructions  Dispense: 21 tablet; Refill: 0  3. Acute cough - benzonatate (TESSALON) 100 MG capsule; Take 1 capsule (100 mg total) by mouth 3 (three) times daily as needed.  Dispense: 30 capsule; Refill: 0  - Added a couple more days of Augmentin to complete a full 7 day treatment over the 5 day treatment - Prednisone added for ear pain - Tessalon for cough - May continue Mucinex DM, Delsym, or Robitussin if needed - Tylenol for pain - Warm compress to ear - Push fluids - Rest - Seek in person evaluation if not improving or if symptoms worsen  Follow Up Instructions: I discussed the assessment and treatment plan with the patient. The patient was provided an  opportunity to ask questions and all were answered. The patient agreed with the plan and demonstrated an understanding of the instructions.  A copy of instructions were sent to the patient via MyChart unless otherwise noted below.    The patient was advised to call back or seek an in-person evaluation if the symptoms worsen or if the condition fails to improve as anticipated.  Time:  I spent 8 minutes with the patient via telehealth technology discussing the above problems/concerns.    Margaretann Loveless, PA-C

## 2022-06-15 ENCOUNTER — Telehealth: Payer: BC Managed Care – PPO | Admitting: Physician Assistant

## 2022-06-15 DIAGNOSIS — R3989 Other symptoms and signs involving the genitourinary system: Secondary | ICD-10-CM | POA: Diagnosis not present

## 2022-06-15 DIAGNOSIS — B379 Candidiasis, unspecified: Secondary | ICD-10-CM

## 2022-06-15 DIAGNOSIS — T3695XA Adverse effect of unspecified systemic antibiotic, initial encounter: Secondary | ICD-10-CM | POA: Diagnosis not present

## 2022-06-15 MED ORDER — FLUCONAZOLE 150 MG PO TABS: 150.0000 mg | ORAL_TABLET | ORAL | 0 refills | Status: DC | PRN

## 2022-06-15 MED ORDER — SULFAMETHOXAZOLE-TRIMETHOPRIM 800-160 MG PO TABS: 1.0000 | ORAL_TABLET | Freq: Two times a day (BID) | ORAL | 0 refills | Status: DC

## 2022-06-15 NOTE — Progress Notes (Signed)
Virtual Visit Consent   Misty Graham, you are scheduled for a virtual visit with a [facility] provider today. Just as with appointments in the office, your consent must be obtained to participate. Your consent will be active for this visit and any virtual visit you may have with one of our providers in the next 365 days. If you have a MyChart account, a copy of this consent can be sent to you electronically.  As this is a virtual visit, video technology does not allow for your provider to perform a traditional examination. This may limit your provider's ability to fully assess your condition. If your provider identifies any concerns that need to be evaluated in person or the need to arrange testing (such as labs, EKG, etc.), we will make arrangements to do so. Although advances in technology are sophisticated, we cannot ensure that it will always work on either your end or our end. If the connection with a video visit is poor, the visit may have to be switched to a telephone visit. With either a video or telephone visit, we are not always able to ensure that we have a secure connection.  By engaging in this virtual visit, you consent to the provision of healthcare and authorize for your insurance to be billed (if applicable) for the services provided during this visit. Depending on your insurance coverage, you may receive a charge related to this service.  I need to obtain your verbal consent now. Are you willing to proceed with your visit today? Misty Graham has provided verbal consent on 06/15/2022 for a virtual visit (video or telephone). Margaretann Loveless, PA-C  Date: 06/15/2022 1:01 PM  Virtual Visit via Video Note   I, Margaretann Loveless, connected with  Misty Graham  ([id number], [birth year]) on 06/15/22 at  1:00 PM EST by a video-enabled telemedicine application and verified that I am speaking with the correct person using two identifiers.  Location: Patient: Virtual Visit Location  Patient: Home Provider: Virtual Visit Location Provider: Home Office   I discussed the limitations of evaluation and management by telemedicine and the availability of in person appointments. The patient expressed understanding and agreed to proceed.    History of Present Illness: Misty Graham is a 56 y.o. who identifies as a female who was assigned female at birth, and is being seen today for possible UTI.  HPI: Urinary Tract Infection  This is a new problem. The current episode started yesterday (at home test positive for leuks and nitrites). The problem occurs every urination. The problem has been gradually worsening. The quality of the pain is described as aching and burning. The patient is experiencing no pain. There has been no fever. Pertinent negatives include no chills, frequency, hematuria, hesitancy or urgency. Associated symptoms comments: Urinary odor, cloudy. She has tried increased fluids (AZO) for the symptoms. The treatment provided no relief. Her past medical history is significant for recurrent UTIs.     Problems:  Patient Active Problem List   Diagnosis Date Noted   CIN I (cervical intraepithelial neoplasia I) 01/09/2021   ASCUS with positive high risk HPV cervical 07/08/2020   Pneumonia due to COVID-19 virus 07/31/2019   COVID-19 virus infection 07/30/2019   Essential hypertension 07/30/2019   Elevated lactic acid level 07/30/2019   Diabetes mellitus type 2, controlled, without complications (HCC) 12/09/2017   Hypercholesterolemia 12/09/2017   Major depression in remission (HCC) 12/09/2017   Overactive bladder 11/14/2016   Neck pain on right  side 10/21/2015   Diabetes mellitus (HCC) 09/06/2015   Adaptation reaction 07/18/2015   Allergic rhinitis 07/18/2015   Acid reflux 07/18/2015   Restless leg 07/18/2015   BP (high blood pressure) 04/04/2015   Iron deficiency anemia 01/05/2015   Cervical pain 10/29/2009    Allergies: No Known Allergies Medications:   Current Outpatient Medications:    fluconazole (DIFLUCAN) 150 MG tablet, Take 1 tablet (150 mg total) by mouth every 3 (three) days as needed., Disp: 2 tablet, Rfl: 0   sulfamethoxazole-trimethoprim (BACTRIM DS) 800-160 MG tablet, Take 1 tablet by mouth 2 (two) times daily., Disp: 10 tablet, Rfl: 0   atorvastatin (LIPITOR) 40 MG tablet, Take 40 mg by mouth daily., Disp: , Rfl:    benzonatate (TESSALON) 100 MG capsule, Take 1 capsule (100 mg total) by mouth 3 (three) times daily as needed., Disp: 30 capsule, Rfl: 0   cetirizine (ZYRTEC) 10 MG tablet, Take 10 mg by mouth daily., Disp: , Rfl:    Continuous Blood Gluc Sensor (FREESTYLE LIBRE 14 DAY SENSOR) MISC, Apply topically., Disp: , Rfl:    desvenlafaxine (PRISTIQ) 50 MG 24 hr tablet, Take 50 mg by mouth daily. , Disp: , Rfl:    [START ON 06/25/2022] Dulaglutide 3 MG/0.5ML SOPN, Inject into the skin., Disp: , Rfl:    glipiZIDE (GLUCOTROL XL) 5 MG 24 hr tablet, Take 1 tablet (5 mg total) by mouth daily with breakfast., Disp: 30 tablet, Rfl: 0   glucose blood (ONETOUCH VERIO) test strip, To check blood sugar once daily, Disp: 100 each, Rfl: 5   hydrochlorothiazide (HYDRODIURIL) 12.5 MG tablet, Take 12.5 mg by mouth daily., Disp: , Rfl:    JANUVIA 100 MG tablet, TAKE 1 TABLET BY MOUTH EVERY DAY (Patient taking differently: Take 100 mg by mouth daily.), Disp: 90 tablet, Rfl: 1   lisinopril (ZESTRIL) 10 MG tablet, Take 10 mg by mouth daily., Disp: , Rfl:    metFORMIN (GLUCOPHAGE-XR) 500 MG 24 hr tablet, Take 1,000 mg by mouth daily with breakfast. , Disp: , Rfl:    ONETOUCH DELICA LANCETS FINE MISC, To check blood sugar once a day DX:  E11.9, Disp: 100 each, Rfl: 5   pantoprazole (PROTONIX) 40 MG tablet, TAKE 1 TABLET(40 MG) BY MOUTH DAILY, Disp: 90 tablet, Rfl: 0   predniSONE (STERAPRED UNI-PAK 21 TAB) 10 MG (21) TBPK tablet, 6 day taper; take as directed on package instructions, Disp: 21 tablet, Rfl: 0   tolterodine (DETROL LA) 4 MG 24 hr capsule,  Take 1 capsule (4 mg total) by mouth daily., Disp: 90 capsule, Rfl: 3   TRULICITY 0.75 MG/0.5ML SOPN, Inject 0.75 mg into the skin once a week., Disp: , Rfl:   Observations/Objective: Patient is well-developed, well-nourished in no acute distress.  Resting comfortably at home.  Head is normocephalic, atraumatic.  No labored breathing.  Speech is clear and coherent with logical content.  Patient is alert and oriented at baseline.    Assessment and Plan: 1. Suspected UTI - sulfamethoxazole-trimethoprim (BACTRIM DS) 800-160 MG tablet; Take 1 tablet by mouth 2 (two) times daily.  Dispense: 10 tablet; Refill: 0  2. Antibiotic-induced yeast infection - fluconazole (DIFLUCAN) 150 MG tablet; Take 1 tablet (150 mg total) by mouth every 3 (three) days as needed.  Dispense: 2 tablet; Refill: 0  - Worsening symptoms.  - Will treat empirically with Bactrim - May use AZO for bladder spasms - Continue to push fluids.  - Advised to start probiotic, like Align - Diflucan given as  prophylaxis as patient tends to get vaginal yeast infections with antibiotic use - Seek in person evaluation for urine culture if symptoms do not improve or if they worsen.    Follow Up Instructions: I discussed the assessment and treatment plan with the patient. The patient was provided an opportunity to ask questions and all were answered. The patient agreed with the plan and demonstrated an understanding of the instructions.  A copy of instructions were sent to the patient via MyChart unless otherwise noted below.    The patient was advised to call back or seek an in-person evaluation if the symptoms worsen or if the condition fails to improve as anticipated.  Time:  I spent 8 minutes with the patient via telehealth technology discussing the above problems/concerns.    Margaretann Loveless, PA-C

## 2022-06-15 NOTE — Patient Instructions (Signed)
Standley Dakins, thank you for joining Margaretann Loveless, PA-C for today's virtual visit.  While this provider is not your primary care provider (PCP), if your PCP is located in our provider database this encounter information will be shared with them immediately following your visit.   A [facility] MyChart account gives you access to today's visit and all your visits, tests, and labs performed at [facility] " click here if you don't have a [facility] MyChart account or go to [email or web address]  Consent: (Patient) Misty Graham provided verbal consent for this virtual visit at the beginning of the encounter.  Current Medications:  Current Outpatient Medications:    sulfamethoxazole-trimethoprim (BACTRIM DS) 800-160 MG tablet, Take 1 tablet by mouth 2 (two) times daily., Disp: 10 tablet, Rfl: 0   atorvastatin (LIPITOR) 40 MG tablet, Take 40 mg by mouth daily., Disp: , Rfl:    benzonatate (TESSALON) 100 MG capsule, Take 1 capsule (100 mg total) by mouth 3 (three) times daily as needed., Disp: 30 capsule, Rfl: 0   cetirizine (ZYRTEC) 10 MG tablet, Take 10 mg by mouth daily., Disp: , Rfl:    Continuous Blood Gluc Sensor (FREESTYLE LIBRE 14 DAY SENSOR) MISC, Apply topically., Disp: , Rfl:    desvenlafaxine (PRISTIQ) 50 MG 24 hr tablet, Take 50 mg by mouth daily. , Disp: , Rfl:    [START ON 06/25/2022] Dulaglutide 3 MG/0.5ML SOPN, Inject into the skin., Disp: , Rfl:    glipiZIDE (GLUCOTROL XL) 5 MG 24 hr tablet, Take 1 tablet (5 mg total) by mouth daily with breakfast., Disp: 30 tablet, Rfl: 0   glucose blood (ONETOUCH VERIO) test strip, To check blood sugar once daily, Disp: 100 each, Rfl: 5   hydrochlorothiazide (HYDRODIURIL) 12.5 MG tablet, Take 12.5 mg by mouth daily., Disp: , Rfl:    JANUVIA 100 MG tablet, TAKE 1 TABLET BY MOUTH EVERY DAY (Patient taking differently: Take 100 mg by mouth daily.), Disp: 90 tablet, Rfl: 1   lisinopril (ZESTRIL) 10 MG tablet, Take 10 mg by  mouth daily., Disp: , Rfl:    metFORMIN (GLUCOPHAGE-XR) 500 MG 24 hr tablet, Take 1,000 mg by mouth daily with breakfast. , Disp: , Rfl:    ONETOUCH DELICA LANCETS FINE MISC, To check blood sugar once a day DX:  E11.9, Disp: 100 each, Rfl: 5   pantoprazole (PROTONIX) 40 MG tablet, TAKE 1 TABLET(40 MG) BY MOUTH DAILY, Disp: 90 tablet, Rfl: 0   predniSONE (STERAPRED UNI-PAK 21 TAB) 10 MG (21) TBPK tablet, 6 day taper; take as directed on package instructions, Disp: 21 tablet, Rfl: 0   tolterodine (DETROL LA) 4 MG 24 hr capsule, Take 1 capsule (4 mg total) by mouth daily., Disp: 90 capsule, Rfl: 3   TRULICITY 0.75 MG/0.5ML SOPN, Inject 0.75 mg into the skin once a week., Disp: , Rfl:    Medications ordered in this encounter:  Meds ordered this encounter  Medications   sulfamethoxazole-trimethoprim (BACTRIM DS) 800-160 MG tablet    Sig: Take 1 tablet by mouth 2 (two) times daily.    Dispense:  10 tablet    Refill:  0    Order Specific Question:   Supervising Provider    Answer:   Merrilee Jansky [id number]     *If you need refills on other medications prior to your next appointment, please contact your pharmacy*  Follow-Up: Call back or seek an in-person evaluation if the symptoms worsen or if the condition fails to improve as anticipated.  [facility] [phone]  Other Instructions  Align probiotics for recurrent antibiotics  Urinary Tract Infection, Adult  A urinary tract infection (UTI) is an infection of any part of the urinary tract. The urinary tract includes the kidneys, ureters, bladder, and urethra. These organs make, store, and get rid of urine in the body. An upper UTI affects the ureters and kidneys. A lower UTI affects the bladder and urethra. What are the causes? Most urinary tract infections are caused by bacteria in your genital area around your urethra, where urine leaves your body. These bacteria grow and cause inflammation of your urinary  tract. What increases the risk? You are more likely to develop this condition if: You have a urinary catheter that stays in place. You are not able to control when you urinate or have a bowel movement (incontinence). You are female and you: Use a spermicide or diaphragm for birth control. Have low estrogen levels. Are pregnant. You have certain genes that increase your risk. You are sexually active. You take antibiotic medicines. You have a condition that causes your flow of urine to slow down, such as: An enlarged prostate, if you are female. Blockage in your urethra. A kidney stone. A nerve condition that affects your bladder control (neurogenic bladder). Not getting enough to drink, or not urinating often. You have certain medical conditions, such as: Diabetes. A weak disease-fighting system (immunesystem). Sickle cell disease. Gout. Spinal cord injury. What are the signs or symptoms? Symptoms of this condition include: Needing to urinate right away (urgency). Frequent urination. This may include small amounts of urine each time you urinate. Pain or burning with urination. Blood in the urine. Urine that smells bad or unusual. Trouble urinating. Cloudy urine. Vaginal discharge, if you are female. Pain in the abdomen or the lower back. You may also have: Vomiting or a decreased appetite. Confusion. Irritability or tiredness. A fever or chills. Diarrhea. The first symptom in older adults may be confusion. In some cases, they may not have any symptoms until the infection has worsened. How is this diagnosed? This condition is diagnosed based on your medical history and a physical exam. You may also have other tests, including: Urine tests. Blood tests. Tests for STIs (sexually transmitted infections). If you have had more than one UTI, a cystoscopy or imaging studies may be done to determine the cause of the infections. How is this treated? Treatment for this condition  includes: Antibiotic medicine. Over-the-counter medicines to treat discomfort. Drinking enough water to stay hydrated. If you have frequent infections or have other conditions such as a kidney stone, you may need to see a health care provider who specializes in the urinary tract (urologist). In rare cases, urinary tract infections can cause sepsis. Sepsis is a life-threatening condition that occurs when the body responds to an infection. Sepsis is treated in the hospital with IV antibiotics, fluids, and other medicines. Follow these instructions at home:  Medicines Take over-the-counter and prescription medicines only as told by your health care provider. If you were prescribed an antibiotic medicine, take it as told by your health care provider. Do not stop using the antibiotic even if you start to feel better. General instructions Make sure you: Empty your bladder often and completely. Do not hold urine for long periods of time. Empty your bladder after sex. Wipe from front to back after urinating or having a bowel movement if you are female. Use each tissue only one time when you wipe. Drink enough  fluid to keep your urine pale yellow. Keep all follow-up visits. This is important. Contact a health care provider if: Your symptoms do not get better after 1-2 days. Your symptoms go away and then return. Get help right away if: You have severe pain in your back or your lower abdomen. You have a fever or chills. You have nausea or vomiting. Summary A urinary tract infection (UTI) is an infection of any part of the urinary tract, which includes the kidneys, ureters, bladder, and urethra. Most urinary tract infections are caused by bacteria in your genital area. Treatment for this condition often includes antibiotic medicines. If you were prescribed an antibiotic medicine, take it as told by your health care provider. Do not stop using the antibiotic even if you start to feel better. Keep  all follow-up visits. This is important. This information is not intended to replace advice given to you by your health care provider. Make sure you discuss any questions you have with your health care provider. Document Revised: 01/22/2020 Document Reviewed: 01/22/2020 Elsevier Patient Education  2023 Elsevier Inc.    If you have been instructed to have an in-person evaluation today at a local Urgent Care facility, please use the link below. It will take you to a list of all of our available [facility], including address, phone number and hours of operation. Please do not delay care.  [facility]  If you or a family member do not have a primary care provider, use the link below to schedule a visit and establish care. When you choose a [facility] primary care physician or advanced practice provider, you gain a long-term partner in health. Find a Primary Care Provider  Learn more about [facility]'s in-office and virtual care options: [facility] - Get Care Now

## 2022-06-19 ENCOUNTER — Telehealth: Payer: BC Managed Care – PPO | Admitting: Physician Assistant

## 2022-06-19 DIAGNOSIS — R3989 Other symptoms and signs involving the genitourinary system: Secondary | ICD-10-CM

## 2022-06-19 NOTE — Patient Instructions (Signed)
Standley Dakins, thank you for joining Piedad Climes, PA-C for today's virtual visit.  While this provider is not your primary care provider (PCP), if your PCP is located in our provider database this encounter information will be shared with them immediately following your visit.   A [facility] MyChart account gives you access to today's visit and all your visits, tests, and labs performed at [facility] " click here if you don't have a [facility] MyChart account or go to [email or web address]  Consent: (Patient) Misty Graham provided verbal consent for this virtual visit at the beginning of the encounter.  Current Medications:  Current Outpatient Medications:    atorvastatin (LIPITOR) 40 MG tablet, Take 40 mg by mouth daily., Disp: , Rfl:    benzonatate (TESSALON) 100 MG capsule, Take 1 capsule (100 mg total) by mouth 3 (three) times daily as needed., Disp: 30 capsule, Rfl: 0   cetirizine (ZYRTEC) 10 MG tablet, Take 10 mg by mouth daily., Disp: , Rfl:    Continuous Blood Gluc Sensor (FREESTYLE LIBRE 14 DAY SENSOR) MISC, Apply topically., Disp: , Rfl:    desvenlafaxine (PRISTIQ) 50 MG 24 hr tablet, Take 50 mg by mouth daily. , Disp: , Rfl:    [START ON 06/25/2022] Dulaglutide 3 MG/0.5ML SOPN, Inject into the skin., Disp: , Rfl:    fluconazole (DIFLUCAN) 150 MG tablet, Take 1 tablet (150 mg total) by mouth every 3 (three) days as needed., Disp: 2 tablet, Rfl: 0   glipiZIDE (GLUCOTROL XL) 5 MG 24 hr tablet, Take 1 tablet (5 mg total) by mouth daily with breakfast., Disp: 30 tablet, Rfl: 0   glucose blood (ONETOUCH VERIO) test strip, To check blood sugar once daily, Disp: 100 each, Rfl: 5   hydrochlorothiazide (HYDRODIURIL) 12.5 MG tablet, Take 12.5 mg by mouth daily., Disp: , Rfl:    JANUVIA 100 MG tablet, TAKE 1 TABLET BY MOUTH EVERY DAY (Patient taking differently: Take 100 mg by mouth daily.), Disp: 90 tablet, Rfl: 1   lisinopril (ZESTRIL) 10 MG tablet, Take 10 mg by  mouth daily., Disp: , Rfl:    metFORMIN (GLUCOPHAGE-XR) 500 MG 24 hr tablet, Take 1,000 mg by mouth daily with breakfast. , Disp: , Rfl:    ONETOUCH DELICA LANCETS FINE MISC, To check blood sugar once a day DX:  E11.9, Disp: 100 each, Rfl: 5   pantoprazole (PROTONIX) 40 MG tablet, TAKE 1 TABLET(40 MG) BY MOUTH DAILY, Disp: 90 tablet, Rfl: 0   predniSONE (STERAPRED UNI-PAK 21 TAB) 10 MG (21) TBPK tablet, 6 day taper; take as directed on package instructions, Disp: 21 tablet, Rfl: 0   sulfamethoxazole-trimethoprim (BACTRIM DS) 800-160 MG tablet, Take 1 tablet by mouth 2 (two) times daily., Disp: 10 tablet, Rfl: 0   tolterodine (DETROL LA) 4 MG 24 hr capsule, Take 1 capsule (4 mg total) by mouth daily., Disp: 90 capsule, Rfl: 3   TRULICITY 0.75 MG/0.5ML SOPN, Inject 0.75 mg into the skin once a week., Disp: , Rfl:    Medications ordered in this encounter:  No orders of the defined types were placed in this encounter.    *If you need refills on other medications prior to your next appointment, please contact your pharmacy*  Follow-Up: Call back or seek an in-person evaluation if the symptoms worsen or if the condition fails to improve as anticipated.  [facility] [phone]  Other Instructions Use link below to get scheduled in person at one of our urgent cares  if your PCP is unavailable.   If you have been instructed to have an in-person evaluation today at a local Urgent Care facility, please use the link below. It will take you to a list of all of our available [facility], including address, phone number and hours of operation. Please do not delay care.  [facility]  If you or a family member do not have a primary care provider, use the link below to schedule a visit and establish care. When you choose a [facility] primary care physician or advanced practice provider, you gain a long-term partner in health. Find a Primary Care Provider  Learn  more about [facility]'s in-office and virtual care options:  - Get Care Now

## 2022-06-19 NOTE — Progress Notes (Signed)
Virtual Visit Consent   Misty Graham, you are scheduled for a virtual visit with a [facility] provider today. Just as with appointments in the office, your consent must be obtained to participate. Your consent will be active for this visit and any virtual visit you may have with one of our providers in the next 365 days. If you have a MyChart account, a copy of this consent can be sent to you electronically.  As this is a virtual visit, video technology does not allow for your provider to perform a traditional examination. This may limit your provider's ability to fully assess your condition. If your provider identifies any concerns that need to be evaluated in person or the need to arrange testing (such as labs, EKG, etc.), we will make arrangements to do so. Although advances in technology are sophisticated, we cannot ensure that it will always work on either your end or our end. If the connection with a video visit is poor, the visit may have to be switched to a telephone visit. With either a video or telephone visit, we are not always able to ensure that we have a secure connection.  By engaging in this virtual visit, you consent to the provision of healthcare and authorize for your insurance to be billed (if applicable) for the services provided during this visit. Depending on your insurance coverage, you may receive a charge related to this service.  I need to obtain your verbal consent now. Are you willing to proceed with your visit today? Misty Graham has provided verbal consent on 06/19/2022 for a virtual visit (video or telephone). Misty Graham, [place]  Date: 06/19/2022 10:52 AM  Virtual Visit via Video Note   I, Misty Graham, connected with  Misty Graham  ([id number], [birth year]) on 06/19/22 at 10:45 AM EST by a video-enabled telemedicine application and verified that I am speaking with the correct person using two identifiers.  Location: Patient: Virtual Visit Location  Patient: Home Provider: Virtual Visit Location Provider: Home Office   I discussed the limitations of evaluation and management by telemedicine and the availability of in person appointments. The patient expressed understanding and agreed to proceed.    History of Present Illness: Misty Graham is a 56 y.o. who identifies as a female who was assigned female at birth, and is being seen today for worsening UTI symptoms despite treatment with Bactrim starting 4 days ago. Also noting nausea which she is unsure if from the medication or infection itself. Denies fever or vomiting. Has not reached out to her PCP.  HPI: HPI  Problems:  Patient Active Problem List   Diagnosis Date Noted   CIN I (cervical intraepithelial neoplasia I) 01/09/2021   ASCUS with positive high risk HPV cervical 07/08/2020   Pneumonia due to COVID-19 virus 07/31/2019   COVID-19 virus infection 07/30/2019   Essential hypertension 07/30/2019   Elevated lactic acid level 07/30/2019   Diabetes mellitus type 2, controlled, without complications (HCC) 12/09/2017   Hypercholesterolemia 12/09/2017   Major depression in remission (HCC) 12/09/2017   Overactive bladder 11/14/2016   Neck pain on right side 10/21/2015   Diabetes mellitus (HCC) 09/06/2015   Adaptation reaction 07/18/2015   Allergic rhinitis 07/18/2015   Acid reflux 07/18/2015   Restless leg 07/18/2015   BP (high blood pressure) 04/04/2015   Iron deficiency anemia 01/05/2015   Cervical pain 10/29/2009    Allergies: No Known Allergies Medications:  Current Outpatient Medications:    atorvastatin (LIPITOR)  40 MG tablet, Take 40 mg by mouth daily., Disp: , Rfl:    benzonatate (TESSALON) 100 MG capsule, Take 1 capsule (100 mg total) by mouth 3 (three) times daily as needed., Disp: 30 capsule, Rfl: 0   cetirizine (ZYRTEC) 10 MG tablet, Take 10 mg by mouth daily., Disp: , Rfl:    Continuous Blood Gluc Sensor (FREESTYLE LIBRE 14 DAY SENSOR) MISC, Apply topically.,  Disp: , Rfl:    desvenlafaxine (PRISTIQ) 50 MG 24 hr tablet, Take 50 mg by mouth daily. , Disp: , Rfl:    [START ON 06/25/2022] Dulaglutide 3 MG/0.5ML SOPN, Inject into the skin., Disp: , Rfl:    fluconazole (DIFLUCAN) 150 MG tablet, Take 1 tablet (150 mg total) by mouth every 3 (three) days as needed., Disp: 2 tablet, Rfl: 0   glipiZIDE (GLUCOTROL XL) 5 MG 24 hr tablet, Take 1 tablet (5 mg total) by mouth daily with breakfast., Disp: 30 tablet, Rfl: 0   glucose blood (ONETOUCH VERIO) test strip, To check blood sugar once daily, Disp: 100 each, Rfl: 5   hydrochlorothiazide (HYDRODIURIL) 12.5 MG tablet, Take 12.5 mg by mouth daily., Disp: , Rfl:    JANUVIA 100 MG tablet, TAKE 1 TABLET BY MOUTH EVERY DAY (Patient taking differently: Take 100 mg by mouth daily.), Disp: 90 tablet, Rfl: 1   lisinopril (ZESTRIL) 10 MG tablet, Take 10 mg by mouth daily., Disp: , Rfl:    metFORMIN (GLUCOPHAGE-XR) 500 MG 24 hr tablet, Take 1,000 mg by mouth daily with breakfast. , Disp: , Rfl:    ONETOUCH DELICA LANCETS FINE MISC, To check blood sugar once a day DX:  E11.9, Disp: 100 each, Rfl: 5   pantoprazole (PROTONIX) 40 MG tablet, TAKE 1 TABLET(40 MG) BY MOUTH DAILY, Disp: 90 tablet, Rfl: 0   predniSONE (STERAPRED UNI-PAK 21 TAB) 10 MG (21) TBPK tablet, 6 day taper; take as directed on package instructions, Disp: 21 tablet, Rfl: 0   sulfamethoxazole-trimethoprim (BACTRIM DS) 800-160 MG tablet, Take 1 tablet by mouth 2 (two) times daily., Disp: 10 tablet, Rfl: 0   tolterodine (DETROL LA) 4 MG 24 hr capsule, Take 1 capsule (4 mg total) by mouth daily., Disp: 90 capsule, Rfl: 3   TRULICITY 0.75 MG/0.5ML SOPN, Inject 0.75 mg into the skin once a week., Disp: , Rfl:   Observations/Objective: Patient is well-developed, well-nourished in no acute distress.  Resting comfortably at home.  Head is normocephalic, atraumatic.  No labored breathing. Speech is clear and coherent with logical content.  Patient is alert and  oriented at baseline.    Assessment and Plan: 1. Suspected UTI Giving continued and progressing symptoms despite empiric treatment, requires in-person evaluation for exam, UA and culture. She is calling her PCP now. Link sent to get her seen in person at [facility] today if they are unavailable. No charge for video.   Follow Up Instructions: I discussed the assessment and treatment plan with the patient. The patient was provided an opportunity to ask questions and all were answered. The patient agreed with the plan and demonstrated an understanding of the instructions.  A copy of instructions were sent to the patient via MyChart unless otherwise noted below.    The patient was advised to call back or seek an in-person evaluation if the symptoms worsen or if the condition fails to improve as anticipated.  Time:  I spent 8 minutes with the patient via telehealth technology discussing the above problems/concerns.    Misty Climes, PA-C

## 2022-08-31 ENCOUNTER — Telehealth: Payer: BC Managed Care – PPO | Admitting: Nurse Practitioner

## 2022-08-31 DIAGNOSIS — N3000 Acute cystitis without hematuria: Secondary | ICD-10-CM | POA: Diagnosis not present

## 2022-08-31 MED ORDER — NITROFURANTOIN MONOHYD MACRO 100 MG PO CAPS: 100.0000 mg | ORAL_CAPSULE | Freq: Two times a day (BID) | ORAL | 0 refills | Status: AC

## 2022-08-31 NOTE — Progress Notes (Signed)
Virtual Visit Consent   Misty Graham, you are scheduled for a virtual visit with a [facility] provider today. Just as with appointments in the office, your consent must be obtained to participate. Your consent will be active for this visit and any virtual visit you may have with one of our providers in the next 365 days. If you have a MyChart account, a copy of this consent can be sent to you electronically.  As this is a virtual visit, video technology does not allow for your provider to perform a traditional examination. This may limit your provider's ability to fully assess your condition. If your provider identifies any concerns that need to be evaluated in person or the need to arrange testing (such as labs, EKG, etc.), we will make arrangements to do so. Although advances in technology are sophisticated, we cannot ensure that it will always work on either your end or our end. If the connection with a video visit is poor, the visit may have to be switched to a telephone visit. With either a video or telephone visit, we are not always able to ensure that we have a secure connection.  By engaging in this virtual visit, you consent to the provision of healthcare and authorize for your insurance to be billed (if applicable) for the services provided during this visit. Depending on your insurance coverage, you may receive a charge related to this service.  I need to obtain your verbal consent now. Are you willing to proceed with your visit today? Misty Graham has provided verbal consent on 08/31/2022 for a virtual visit (video or telephone). Apolonio Schneiders, FNP  Date: 08/31/2022 2:09 PM  Virtual Visit via Video Note   I, Apolonio Schneiders, connected with  Misty Graham  ([id number], January 26, 1966) on 08/31/22 at  2:15 PM EST by a video-enabled telemedicine application and verified that I am speaking with the correct person using two identifiers.  Location: Patient: Virtual Visit Location Patient:  Home Provider: Virtual Visit Location Provider: Home Office   I discussed the limitations of evaluation and management by telemedicine and the availability of in person appointments. The patient expressed understanding and agreed to proceed.    History of Present Illness: Misty Graham is a 57 y.o. who identifies as a female who was assigned female at birth, and is being seen today for UTI symptoms.  Symptom onset was 4 days ago with urinary urgency  Burning and odor present in urine   She denies fever  Denies N/V   She has had UTIs in the past most recently in December, was initially treated on Bactrim without improvement and was switched to [facility] with improvement   Most recent culture shows no resistance from 11/23    Problems:  Patient Active Problem List   Diagnosis Date Noted   CIN I (cervical intraepithelial neoplasia I) 01/09/2021   ASCUS with positive high risk HPV cervical 07/08/2020   Pneumonia due to COVID-19 virus 07/31/2019   COVID-19 virus infection 07/30/2019   Essential hypertension 07/30/2019   Elevated lactic acid level 07/30/2019   Diabetes mellitus type 2, controlled, without complications ([place]) [id number]   Hypercholesterolemia 12/09/2017   Major depression in remission ([facility]) 12/09/2017   Overactive bladder 11/14/2016   Neck pain on right side 10/21/2015   Diabetes mellitus ([place]) 09/06/2015   Adaptation reaction 07/18/2015   Allergic rhinitis 07/18/2015   Acid reflux 07/18/2015   Restless leg 07/18/2015   BP (high blood pressure) 04/04/2015  Iron deficiency anemia 01/05/2015   Cervical pain 10/29/2009    Allergies: No Known Allergies Medications:  Current Outpatient Medications:    atorvastatin (LIPITOR) 40 MG tablet, Take 40 mg by mouth daily., Disp: , Rfl:    benzonatate (TESSALON) 100 MG capsule, Take 1 capsule (100 mg total) by mouth 3 (three) times daily as needed., Disp: 30 capsule, Rfl: 0   cetirizine (ZYRTEC) 10 MG tablet, Take 10 mg  by mouth daily., Disp: , Rfl:    Continuous Blood Gluc Sensor (FREESTYLE LIBRE 14 DAY SENSOR) MISC, Apply topically., Disp: , Rfl:    desvenlafaxine (PRISTIQ) 50 MG 24 hr tablet, Take 50 mg by mouth daily. , Disp: , Rfl:    Dulaglutide 3 MG/0.5ML SOPN, Inject into the skin., Disp: , Rfl:    fluconazole (DIFLUCAN) 150 MG tablet, Take 1 tablet (150 mg total) by mouth every 3 (three) days as needed., Disp: 2 tablet, Rfl: 0   glipiZIDE (GLUCOTROL XL) 5 MG 24 hr tablet, Take 1 tablet (5 mg total) by mouth daily with breakfast., Disp: 30 tablet, Rfl: 0   glucose blood (ONETOUCH VERIO) test strip, To check blood sugar once daily, Disp: 100 each, Rfl: 5   hydrochlorothiazide (HYDRODIURIL) 12.5 MG tablet, Take 12.5 mg by mouth daily., Disp: , Rfl:    JANUVIA 100 MG tablet, TAKE 1 TABLET BY MOUTH EVERY DAY (Patient taking differently: Take 100 mg by mouth daily.), Disp: 90 tablet, Rfl: 1   lisinopril (ZESTRIL) 10 MG tablet, Take 10 mg by mouth daily., Disp: , Rfl:    metFORMIN (GLUCOPHAGE-XR) 500 MG 24 hr tablet, Take 1,000 mg by mouth daily with breakfast. , Disp: , Rfl:    ONETOUCH DELICA LANCETS FINE MISC, To check blood sugar once a day DX:  E11.9, Disp: 100 each, Rfl: 5   pantoprazole (PROTONIX) 40 MG tablet, TAKE 1 TABLET(40 MG) BY MOUTH DAILY, Disp: 90 tablet, Rfl: 0   predniSONE (STERAPRED UNI-PAK 21 TAB) 10 MG (21) TBPK tablet, 6 day taper; take as directed on package instructions, Disp: 21 tablet, Rfl: 0   sulfamethoxazole-trimethoprim (BACTRIM DS) 800-160 MG tablet, Take 1 tablet by mouth 2 (two) times daily., Disp: 10 tablet, Rfl: 0   tolterodine (DETROL LA) 4 MG 24 hr capsule, Take 1 capsule (4 mg total) by mouth daily., Disp: 90 capsule, Rfl: 3   TRULICITY [id number] SOPN, Inject 0.75 mg into the skin once a week., Disp: , Rfl:   Observations/Objective: Patient is well-developed, well-nourished in no acute distress.  Resting comfortably  at home.  Head is normocephalic, atraumatic.  No  labored breathing.  Speech is clear and coherent with logical content.  Patient is alert and oriented at baseline.    Assessment and Plan: 1. Acute cystitis without hematuria Increase fluids:  - nitrofurantoin, macrocrystal-monohydrate, (MACROBID) 100 MG capsule; Take 1 capsule (100 mg total) by mouth 2 (two) times daily for 7 days.  Dispense: 14 capsule; Refill: 0     If no improvement or with worsening symptoms in the next 48 hours follow up in person for culture as discussed  Follow Up Instructions: I discussed the assessment and treatment plan with the patient. The patient was provided an opportunity to ask questions and all were answered. The patient agreed with the plan and demonstrated an understanding of the instructions.  A copy of instructions were sent to the patient via MyChart unless otherwise noted below.    The patient was advised to call back or seek an in-person  evaluation if the symptoms worsen or if the condition fails to improve as anticipated.  Time:  I spent 10 minutes with the patient via telehealth technology discussing the above problems/concerns.    Apolonio Schneiders, FNP

## 2022-09-06 ENCOUNTER — Other Ambulatory Visit: Payer: Self-pay | Admitting: Internal Medicine

## 2022-09-06 DIAGNOSIS — Z1231 Encounter for screening mammogram for malignant neoplasm of breast: Secondary | ICD-10-CM

## 2022-10-22 ENCOUNTER — Ambulatory Visit
Admission: RE | Admit: 2022-10-22 | Discharge: 2022-10-22 | Disposition: A | Discharge status: Home / Self Care | Payer: BC Managed Care – PPO | Source: Ambulatory Visit | Attending: Internal Medicine | Admitting: Internal Medicine

## 2022-10-22 DIAGNOSIS — Z1231 Encounter for screening mammogram for malignant neoplasm of breast: Secondary | ICD-10-CM | POA: Diagnosis present

## 2022-10-25 ENCOUNTER — Telehealth: Payer: Self-pay

## 2022-10-25 NOTE — Telephone Encounter (Signed)
Pls call pt to schedule annula and then I'll RF Rx till appt

## 2022-10-26 NOTE — Telephone Encounter (Signed)
I contacted the patient via phone. I left voicemail message for the patient to call back to scheduled an appointment.

## 2022-10-29 ENCOUNTER — Other Ambulatory Visit: Payer: Self-pay | Admitting: Obstetrics and Gynecology

## 2022-10-29 DIAGNOSIS — N3281 Overactive bladder: Secondary | ICD-10-CM

## 2022-10-29 MED ORDER — TOLTERODINE TARTRATE ER 4 MG PO CP24: 4.0000 mg | ORAL_CAPSULE | Freq: Every day | ORAL | 0 refills | Status: DC

## 2022-10-29 NOTE — Telephone Encounter (Signed)
Rx RF eRxd.  

## 2022-10-29 NOTE — Progress Notes (Signed)
Rx RF detrol till 7/24 annual

## 2022-10-29 NOTE — Telephone Encounter (Signed)
The patient is scheduled for 7/5 with ABC

## 2022-11-05 ENCOUNTER — Other Ambulatory Visit: Payer: Self-pay | Admitting: Gastroenterology

## 2022-11-21 ENCOUNTER — Telehealth: Payer: BC Managed Care – PPO | Admitting: Gastroenterology

## 2022-11-21 ENCOUNTER — Encounter: Payer: Self-pay | Admitting: Gastroenterology

## 2022-11-21 DIAGNOSIS — K219 Gastro-esophageal reflux disease without esophagitis: Secondary | ICD-10-CM | POA: Diagnosis not present

## 2022-11-21 MED ORDER — PANTOPRAZOLE SODIUM 40 MG PO TBEC: DELAYED_RELEASE_TABLET | ORAL | 3 refills | Status: DC

## 2022-11-21 NOTE — Addendum Note (Signed)
Addended by: Roena Malady on: 11/21/2022 09:30 AM   Modules accepted: Orders

## 2022-11-21 NOTE — Progress Notes (Signed)
Midge Minium, MD [address]  [address]  [place], [place] [address]  Main: [phone]  Fax: [phone] Gastroenterology Virtual/Video Visit  Referring Provider:     [facility] Primary Care Physician:  [facility] Primary Gastroenterologist:  Dr.Nayla Dias Servando Snare Reason for Consultation:     GERD        HPI:    Virtual Visit via Video Note Location of the patient: Work Location of provider: Home Office Participating persons: The patient and myself.  I connected with Misty Graham on 11/21/22 at  8:30 AM EDT by a video enabled telemedicine application and verified that I am speaking with the correct person using two identifiers.   I discussed the limitations of evaluation and management by telemedicine and the availability of in person appointments. The patient expressed understanding and agreed to proceed.  Verbal consent to proceed obtained.  History of Present Illness: Misty Graham is a 57 y.o. female referred by Dr. [facility]  for consultation & management of GERD.  The patient follows up with me for her reflux symptoms and her medication refills.  The patient had been on Dexilant in the past but her insurance stopped covering it so she was switched to pantoprazole.  The patient reports that the pantoprazole takes care of her symptoms but is not as good as the Dexilant.  She denies any unexplained weight loss but has lost 30 pounds on a PLG 1 medication.  She was having hair loss and could not handle the nausea therefore recently stopped this medication.  There is no report of any dysphagia black stools or bloody stools.  The patient's last colonoscopy was 2015 and she is not due for repeat colonoscopy until next year.  Past Medical History:  Diagnosis Date   Depression    Diabetes mellitus without complication (HCC)    Family history of ovarian cancer    Pt is My Risk neg 5/16 except NBN VUS   GERD (gastroesophageal reflux disease)     Hypertension    Iron deficiency     Past Surgical History:  Procedure Laterality Date   BREAST BIOPSY Right 2014   calcs with Sankar, benign, with clip   BREAST SURGERY Right    biopsy; Dr. Evette Cristal   CERVICAL DISCECTOMY  03/15/2010   CESAREAN SECTION  1991   COLONOSCOPY     LASIK     VAGINAL BIRTH AFTER CESAREAN SECTION  1993 and 1997    Prior to Admission medications   Medication Sig Start Date End Date Taking? Authorizing Provider  atorvastatin (LIPITOR) 40 MG tablet Take 40 mg by mouth daily. 03/17/19   [provider]  benzonatate (TESSALON) 100 MG capsule Take 1 capsule (100 mg total) by mouth 3 (three) times daily as needed. 06/05/22   Margaretann Loveless, PA-C  cetirizine (ZYRTEC) 10 MG tablet Take 10 mg by mouth daily.    [provider]  Continuous Blood Gluc Sensor (FREESTYLE LIBRE 14 DAY SENSOR) MISC Apply topically. 06/06/20   [provider]  desvenlafaxine (PRISTIQ) 50 MG 24 hr tablet Take 50 mg by mouth daily.  11/20/18   [provider]  Dulaglutide 3 MG/0.5ML SOPN Inject into the skin. 06/25/22   [provider]  fluconazole (DIFLUCAN) 150 MG tablet Take 1 tablet (150 mg total) by mouth every 3 (three) days as needed. 06/15/22   Margaretann Loveless, PA-C  glipiZIDE (GLUCOTROL XL) 5 MG 24 hr tablet Take 1 tablet (5  mg total) by mouth daily with breakfast. 04/17/18   Bosie Clos, MD  glucose blood [facility] VERIO) test strip To check blood sugar once daily 10/17/16   Joycelyn Man M, PA-C  hydrochlorothiazide (HYDRODIURIL) 12.5 MG tablet Take 12.5 mg by mouth daily. 05/13/19   [provider]  JANUVIA 100 MG tablet TAKE 1 TABLET BY MOUTH EVERY DAY Patient taking differently: Take 100 mg by mouth daily. 02/26/18   Margaretann Loveless, PA-C  lisinopril (ZESTRIL) 10 MG tablet Take 10 mg by mouth daily. 05/13/19   [provider]  metFORMIN (GLUCOPHAGE-XR) 500 MG 24 hr tablet Take 1,000 mg by mouth daily  with breakfast.  05/08/19   [provider]  [facility] DELICA LANCETS FINE MISC To check blood sugar once a day DX:  E11.9 07/29/15   Lorie Phenix, MD  pantoprazole (PROTONIX) 40 MG tablet TAKE 1 TABLET(40 MG) BY MOUTH DAILY 11/06/22   Midge Minium, MD  predniSONE (STERAPRED UNI-PAK 21 TAB) 10 MG (21) TBPK tablet 6 day taper; take as directed on package instructions 06/05/22   Margaretann Loveless, PA-C  sulfamethoxazole-trimethoprim (BACTRIM DS) 800-160 MG tablet Take 1 tablet by mouth 2 (two) times daily. 06/15/22   Margaretann Loveless, PA-C  tolterodine (DETROL LA) 4 MG 24 hr capsule Take 1 capsule (4 mg total) by mouth daily. 10/29/22   Copland, Alicia B, PA-C  TRULICITY 0.75 MG/0.5ML SOPN Inject 0.75 mg into the skin once a week.    [provider]    Family History  Problem Relation Age of Onset   Lung cancer Mother    COPD Father    Hypertension Father    Allergies Father    Coronary artery disease Father    Healthy Sister    Heart attack Paternal Grandmother    Ovarian cancer Paternal Grandmother    Heart attack Paternal Grandfather    Breast cancer Neg Hx      Social History   Tobacco Use   Smoking status: Never   Smokeless tobacco: Never  Vaping Use   Vaping Use: Never used  Substance Use Topics   Alcohol use: No   Drug use: No    Allergies as of 11/21/2022   (No Known Allergies)    Review of Systems:    All systems reviewed and negative except where noted in HPI.   Observations/Objective:  Labs: CBC    Component Value Date/Time   WBC 4.1 08/01/2019 0056   RBC 4.18 08/01/2019 0056   HGB 13.3 08/01/2019 0056   HGB 15.5 02/02/2016 1454   HCT 38.3 08/01/2019 0056   HCT 46.3 02/02/2016 1454   PLT 127 (L) 08/01/2019 0056   PLT 189 02/02/2016 1454   MCV 91.6 08/01/2019 0056   MCV 98 (H) 02/02/2016 1454   MCH 31.8 08/01/2019 0056   MCHC 34.7 08/01/2019 0056   RDW 11.5 08/01/2019 0056   RDW 13.2 02/02/2016 1454   LYMPHSABS 2.2 08/01/2019  0056   LYMPHSABS 2.8 02/02/2016 1454   MONOABS 0.5 08/01/2019 0056   EOSABS 0.0 08/01/2019 0056   EOSABS 0.3 02/02/2016 1454   BASOSABS 0.0 08/01/2019 0056   BASOSABS 0.0 02/02/2016 1454   CMP     Component Value Date/Time   NA 138 08/01/2019 0056   NA 144 02/02/2016 1454   K 3.6 08/01/2019 0056   CL 102 08/01/2019 0056   CO2 25 08/01/2019 0056   GLUCOSE 140 (H) 08/01/2019 0056   BUN 15 08/01/2019 0056  BUN 11 02/02/2016 1454   CREATININE 0.51 08/01/2019 0056   CALCIUM 8.7 (L) 08/01/2019 0056   PROT 6.9 08/01/2019 0056   PROT 6.9 02/02/2016 1454   ALBUMIN 4.1 08/01/2019 0056   ALBUMIN 4.8 02/02/2016 1454   AST 25 08/01/2019 0056   ALT 51 (H) 08/01/2019 0056   ALKPHOS 79 08/01/2019 0056   BILITOT 0.9 08/01/2019 0056   BILITOT 0.4 02/02/2016 1454   GFRNONAA >60 08/01/2019 0056   GFRAA >60 08/01/2019 0056    Imaging Studies: MM 3D SCREENING MAMMOGRAM BILATERAL BREAST  Result Date: 10/24/2022 CLINICAL DATA:  Screening. EXAM: DIGITAL SCREENING BILATERAL MAMMOGRAM WITH TOMOSYNTHESIS AND CAD TECHNIQUE: Bilateral screening digital craniocaudal and mediolateral oblique mammograms were obtained. Bilateral screening digital breast tomosynthesis was performed. The images were evaluated with computer-aided detection. COMPARISON:  Previous exam(s). ACR Breast Density Category c: The breasts are heterogeneously dense, which may obscure small masses. FINDINGS: There are no findings suspicious for malignancy. IMPRESSION: No mammographic evidence of malignancy. A result letter of this screening mammogram will be mailed directly to the patient. RECOMMENDATION: Screening mammogram in one year. (Code:SM-B-01Y) BI-RADS CATEGORY  1: Negative. Electronically Signed   By: Hulan Saas M.D.   On: 10/24/2022 10:56    Assessment and Plan:   Misty Graham is a 57 y.o. y/o female has been referred for follow-up for medication refill.  The patient will be continued on her pantoprazole.  The patient  will need to be set up for colonoscopy for next year which will be 10 years after her last colonoscopy.  The patient is not having any worry symptoms at this time.  The patient has been explained the plan and agrees with it.  Follow Up Instructions:  I discussed the assessment and treatment plan with the patient. The patient was provided an opportunity to ask questions and all were answered. The patient agreed with the plan and demonstrated an understanding of the instructions.   The patient was advised to call back or seek an in-person evaluation if the symptoms worsen or if the condition fails to improve as anticipated.  I provided 25 minutes of non-face-to-face time during this encounter including chart review In preparation for the encounter.   Midge Minium, MD  Speech recognition software was used to dictate the above note.

## 2022-12-27 NOTE — Progress Notes (Signed)
PCP: [facility]   Chief Complaint  Patient presents with   Gynecologic Exam    No concerns    HPI:      [name] is a 57 y.o. [id number] whose LMP was Patient's last menstrual period was 07/18/2010., presents today for her annual examination.  Her menses are absent due to menopause. No PMB.  She does not have vasomotor sx.   Sex activity: single partner, contraception - post menopausal status. She does not have vaginal dryness/pain/bleeding.  Last Pap: 02/27/22 Results were: no abnormalities /neg HPV DNA. Repeat due today.  08/23/21 ASCUS 01/09/21 ASCUS 07/08/20 Colpo with CIN 1 on bx 06/15/20 ASCUS/pos HPV DNA ASCUS 6 mos ago; CIN I by Biopsy 12 mos ago; Hx of STDs: HPV  Last mammogram: 10/22/22  Results were: normal--routine follow-up in 12 months There is no FH of breast cancer. There is a FH of ovarian cancerin her PGM. Pt is MyRIsk neg 2016 except NBN VUS.The patient does not do self-breast exams.  Colonoscopy: ~8 yrs ago, Repeat due after 10 years.   Tobacco use: The patient denies current or previous tobacco use. Alcohol use: none No drug use Exercise: not active  She does get adequate calcium but not Vitamin D in her diet. Hx of Vit D deficiency in past.   Labs with PCP.   Patient Active Problem List   Diagnosis Date Noted   CIN I (cervical intraepithelial neoplasia I) 01/09/2021   ASCUS with positive high risk HPV cervical 07/08/2020   Pneumonia due to COVID-19 virus 07/31/2019   COVID-19 virus infection 07/30/2019   Essential hypertension 07/30/2019   Elevated lactic acid level 07/30/2019   Diabetes mellitus type 2, controlled, without complications (HCC) 12/09/2017   Hypercholesterolemia 12/09/2017   Major depression in remission (HCC) 12/09/2017   Overactive bladder 11/14/2016   Neck pain on right side 10/21/2015   Diabetes mellitus (HCC) 09/06/2015   Adaptation reaction 07/18/2015   Allergic rhinitis 07/18/2015   Acid reflux 07/18/2015    Restless leg 07/18/2015   BP (high blood pressure) 04/04/2015   Iron deficiency anemia 01/05/2015   Cervical pain 10/29/2009   Past Medical History:  Diagnosis Date   BRCA negative 2016   MyRisk neg except NBN VUS   Depression    Diabetes mellitus without complication (HCC)    Family history of ovarian cancer    Pt is My Risk neg 5/16 except NBN VUS   GERD (gastroesophageal reflux disease)    Hypertension    Iron deficiency     Past Surgical History:  Procedure Laterality Date   BREAST BIOPSY Right 2014   calcs with Sankar, benign, with clip   BREAST SURGERY Right    biopsy; Dr. Evette Cristal   CERVICAL DISCECTOMY  03/15/2010   CESAREAN SECTION  1991   COLONOSCOPY     LASIK     VAGINAL BIRTH AFTER CESAREAN SECTION  1993 and 1997    Family History  Problem Relation Age of Onset   Lung cancer Mother    COPD Father    Hypertension Father    Allergies Father    Coronary artery disease Father    Healthy Sister    Heart attack Paternal Grandmother    Ovarian cancer Paternal Grandmother    Heart attack Paternal Grandfather    Breast cancer Neg Hx     Social History   Socioeconomic History   Marital status: Divorced    Spouse name: Not on file  Number of children: Not on file   Years of education: Not on file   Highest education level: Not on file  Occupational History   Not on file  Tobacco Use   Smoking status: Never   Smokeless tobacco: Never  Vaping Use   Vaping Use: Never used  Substance and Sexual Activity   Alcohol use: No   Drug use: No   Sexual activity: Yes    Birth control/protection: Post-menopausal  Other Topics Concern   Not on file  Social History Narrative   Not on file   Social Determinants of Health   Financial Resource Strain: Not on file  Food Insecurity: Not on file  Transportation Needs: Not on file  Physical Activity: Not on file  Stress: Not on file  Social Connections: Not on file  Intimate Partner Violence: Not on file      Current Outpatient Medications:    atorvastatin (LIPITOR) 40 MG tablet, Take 40 mg by mouth daily., Disp: , Rfl:    cetirizine (ZYRTEC) 10 MG tablet, Take 10 mg by mouth daily., Disp: , Rfl:    Continuous Blood Gluc Sensor (FREESTYLE LIBRE 14 DAY SENSOR) MISC, Apply topically., Disp: , Rfl:    desvenlafaxine (PRISTIQ) 50 MG 24 hr tablet, Take 50 mg by mouth daily. , Disp: , Rfl:    glucose blood (ONETOUCH VERIO) test strip, To check blood sugar once daily, Disp: 100 each, Rfl: 5   hydrochlorothiazide (HYDRODIURIL) 12.5 MG tablet, Take 12.5 mg by mouth daily., Disp: , Rfl:    lisinopril (ZESTRIL) 10 MG tablet, Take 10 mg by mouth daily., Disp: , Rfl:    ondansetron (ZOFRAN) 4 MG tablet, Take by mouth., Disp: , Rfl:    ONETOUCH DELICA LANCETS FINE MISC, To check blood sugar once a day DX:  E11.9, Disp: 100 each, Rfl: 5   pantoprazole (PROTONIX) 40 MG tablet, TAKE 1 TABLET(40 MG) BY MOUTH DAILY, Disp: 90 tablet, Rfl: 3   Semaglutide, 1 MG/DOSE, 4 MG/3ML SOPN, Inject into the skin., Disp: , Rfl:    tolterodine (DETROL LA) 4 MG 24 hr capsule, Take 1 capsule (4 mg total) by mouth daily., Disp: 90 capsule, Rfl: 0   Vitamin D, Ergocalciferol, (DRISDOL) 1.25 MG (50000 UNIT) CAPS capsule, Take 50,000 Units by mouth once a week., Disp: , Rfl:      ROS:  Review of Systems  Constitutional:  Negative for fatigue, fever and unexpected weight change.  Respiratory:  Negative for cough, shortness of breath and wheezing.   Cardiovascular:  Negative for chest pain, palpitations and leg swelling.  Gastrointestinal:  Negative for blood in stool, constipation, diarrhea, nausea and vomiting.  Endocrine: Negative for cold intolerance, heat intolerance and polyuria.  Genitourinary:  Negative for dyspareunia, dysuria, flank pain, frequency, genital sores, hematuria, menstrual problem, pelvic pain, urgency, vaginal bleeding, vaginal discharge and vaginal pain.  Musculoskeletal:  Negative for back pain, joint  swelling and myalgias.  Skin:  Negative for rash.  Neurological:  Negative for dizziness, syncope, light-headedness, numbness and headaches.  Hematological:  Negative for adenopathy.  Psychiatric/Behavioral:  Negative for agitation, confusion, sleep disturbance and suicidal ideas. The patient is not nervous/anxious.    BREAST: No symptoms    Objective: BP 110/60   Ht 5\' 6"  (1.676 m)   Wt 141 lb (64 kg)   LMP 07/18/2010   BMI 22.76 kg/m    Physical Exam Constitutional:      Appearance: She is well-developed.  Genitourinary:     Vulva normal.  Right Labia: No rash, tenderness or lesions.    Left Labia: No tenderness, lesions or rash.    No vaginal discharge, erythema or tenderness.      Right Adnexa: not tender and no mass present.    Left Adnexa: not tender and no mass present.    No cervical friability or polyp.     Uterus is not enlarged or tender.  Breasts:    Right: No mass, nipple discharge, skin change or tenderness.     Left: No mass, nipple discharge, skin change or tenderness.  Neck:     Thyroid: No thyromegaly.  Cardiovascular:     Rate and Rhythm: Normal rate and regular rhythm.     Heart sounds: Normal heart sounds. No murmur heard. Pulmonary:     Effort: Pulmonary effort is normal.     Breath sounds: Normal breath sounds.  Abdominal:     Palpations: Abdomen is soft.     Tenderness: There is no abdominal tenderness. There is no guarding or rebound.  Musculoskeletal:        General: Normal range of motion.     Cervical back: Normal range of motion.  Lymphadenopathy:     Cervical: No cervical adenopathy.  Neurological:     General: No focal deficit present.     Mental Status: She is alert and oriented to person, place, and time.     Cranial Nerves: No cranial nerve deficit.  Skin:    General: Skin is warm and dry.  Psychiatric:        Mood and Affect: Mood normal.        Behavior: Behavior normal.        Thought Content: Thought content normal.         Judgment: Judgment normal.  Vitals reviewed.     Assessment/Plan:  Encounter for annual routine gynecological examination  Cervical cancer screening - Plan: Cytology - PAP  Screening for HPV (human papillomavirus) - Plan: Cytology - PAP  CIN I (cervical intraepithelial neoplasia I) - Plan: Cytology - PAP; repeat pap today, will f/u with results.   Encounter for screening mammogram for malignant neoplasm of breast; pt current on mammo  Family history of ovarian cancer--pt is MyRisk neg. No increased screening recommendations          GYN counsel menopause, adequate intake of calcium and vitamin D, diet and exercise    F/U  Return in about 1 year (around 12/28/2023).  Shakedra Beam B. Michaeal Davis, PA-C 12/28/2022 11:19 AM

## 2022-12-28 ENCOUNTER — Ambulatory Visit (INDEPENDENT_AMBULATORY_CARE_PROVIDER_SITE_OTHER): Payer: BC Managed Care – PPO | Admitting: Obstetrics and Gynecology

## 2022-12-28 ENCOUNTER — Encounter: Payer: Self-pay | Admitting: Obstetrics and Gynecology

## 2022-12-28 ENCOUNTER — Other Ambulatory Visit (HOSPITAL_COMMUNITY): Admission: RE | Admit: 2022-12-28 | Payer: BC Managed Care – PPO | Source: Ambulatory Visit

## 2022-12-28 VITALS — BP 110/60 | Ht 66.0 in | Wt 141.0 lb

## 2022-12-28 DIAGNOSIS — N870 Mild cervical dysplasia: Secondary | ICD-10-CM | POA: Insufficient documentation

## 2022-12-28 DIAGNOSIS — Z124 Encounter for screening for malignant neoplasm of cervix: Secondary | ICD-10-CM | POA: Insufficient documentation

## 2022-12-28 DIAGNOSIS — Z1211 Encounter for screening for malignant neoplasm of colon: Secondary | ICD-10-CM

## 2022-12-28 DIAGNOSIS — Z01419 Encounter for gynecological examination (general) (routine) without abnormal findings: Secondary | ICD-10-CM | POA: Diagnosis not present

## 2022-12-28 DIAGNOSIS — Z1151 Encounter for screening for human papillomavirus (HPV): Secondary | ICD-10-CM

## 2022-12-28 DIAGNOSIS — Z1231 Encounter for screening mammogram for malignant neoplasm of breast: Secondary | ICD-10-CM

## 2022-12-28 NOTE — Patient Instructions (Signed)
I value your feedback and you entrusting us with your care. If you get a Home patient survey, I would appreciate you taking the time to let us know about your experience today. Thank you! ? ? ?

## 2023-01-02 LAB — CYTOLOGY - PAP
Comment: NEGATIVE
Diagnosis: NEGATIVE
High risk HPV: POSITIVE — AB

## 2023-01-17 ENCOUNTER — Other Ambulatory Visit: Payer: Self-pay | Admitting: Obstetrics and Gynecology

## 2023-01-17 DIAGNOSIS — N3281 Overactive bladder: Secondary | ICD-10-CM

## 2023-02-01 ENCOUNTER — Other Ambulatory Visit: Payer: Self-pay | Admitting: Internal Medicine

## 2023-02-01 DIAGNOSIS — H53462 Homonymous bilateral field defects, left side: Secondary | ICD-10-CM

## 2023-02-05 ENCOUNTER — Ambulatory Visit
Admission: RE | Admit: 2023-02-05 | Discharge: 2023-02-05 | Disposition: A | Discharge status: Home / Self Care | Payer: BC Managed Care – PPO | Source: Ambulatory Visit | Attending: Internal Medicine | Admitting: Internal Medicine

## 2023-02-05 DIAGNOSIS — H53462 Homonymous bilateral field defects, left side: Secondary | ICD-10-CM | POA: Diagnosis not present

## 2023-02-21 ENCOUNTER — Encounter: Payer: Self-pay | Admitting: Obstetrics and Gynecology

## 2023-02-21 ENCOUNTER — Other Ambulatory Visit: Payer: Self-pay

## 2023-02-21 ENCOUNTER — Other Ambulatory Visit: Payer: Self-pay | Admitting: Obstetrics and Gynecology

## 2023-02-21 DIAGNOSIS — N3281 Overactive bladder: Secondary | ICD-10-CM

## 2023-02-21 MED ORDER — TOLTERODINE TARTRATE ER 4 MG PO CP24: 4.0000 mg | ORAL_CAPSULE | Freq: Every day | ORAL | 3 refills | Status: DC

## 2023-02-21 NOTE — Telephone Encounter (Signed)
Pt needs RF and pharmacy is telling her rx not in system. Called her pharmacy and was advised last Rx they have is from 10/2022. Advised 1 yr supply sent on 01/17/23 and they do not have it (even tho we have receipt confirmation). Resending Rx.

## 2023-09-25 ENCOUNTER — Telehealth: Payer: Self-pay

## 2023-09-25 MED ORDER — PANTOPRAZOLE SODIUM 40 MG PO TBEC: DELAYED_RELEASE_TABLET | ORAL | 0 refills | Status: AC

## 2023-09-25 NOTE — Telephone Encounter (Signed)
 Pt called to schedule a f/u appt with Dr Servando Snare... I let pt know of the changes going on in the practice as well as Dr Servando Snare becoming a hospitalist... At this time pt would like to stay with the practice and hope to establish with a new provider in the next few months if available

## 2023-09-30 ENCOUNTER — Ambulatory Visit
Admission: RE | Admit: 2023-09-30 | Discharge: 2023-09-30 | Disposition: A | Discharge status: Home / Self Care | Source: Ambulatory Visit | Attending: Emergency Medicine | Admitting: Emergency Medicine

## 2023-09-30 VITALS — BP 131/80 | HR 75 | Temp 98.1°F | Resp 16

## 2023-09-30 DIAGNOSIS — N390 Urinary tract infection, site not specified: Secondary | ICD-10-CM | POA: Diagnosis not present

## 2023-09-30 DIAGNOSIS — R319 Hematuria, unspecified: Secondary | ICD-10-CM | POA: Diagnosis not present

## 2023-09-30 LAB — URINALYSIS, W/ REFLEX TO CULTURE (INFECTION SUSPECTED)
Bilirubin Urine: NEGATIVE
Glucose, UA: NEGATIVE mg/dL
Ketones, ur: NEGATIVE mg/dL
Nitrite: NEGATIVE
Protein, ur: 30 mg/dL — AB
Specific Gravity, Urine: 1.015 (ref 1.005–1.030)
WBC, UA: >50 WBC/hpf (ref 0–5)
pH: 6.5 (ref 5.0–8.0)

## 2023-09-30 MED ORDER — NITROFURANTOIN MONOHYD MACRO 100 MG PO CAPS: 100.0000 mg | ORAL_CAPSULE | Freq: Two times a day (BID) | ORAL | 0 refills | Status: AC

## 2023-09-30 MED ORDER — PHENAZOPYRIDINE HCL 200 MG PO TABS: 200.0000 mg | ORAL_TABLET | Freq: Three times a day (TID) | ORAL | 0 refills | Status: DC | PRN

## 2023-09-30 NOTE — ED Triage Notes (Signed)
 Pt presents with dysuria, urinary frequency and urine odor x 2 days.

## 2023-09-30 NOTE — ED Provider Notes (Signed)
 HPI  SUBJECTIVE:  Misty Graham is a 58 y.o. female who presents with 3 days of odorous urine, dysuria that lingers for approximately 30 to 40 minutes after urination, urgency and frequency.  No cloudy urine, hematuria, nausea vomiting, fevers, abdominal, back, pelvic pain, vaginal odor, bleeding or discharge.  She is in a long-term monogamous relationship with a female, who is asymptomatic.  STDs are not a concern today.  No antipyretic in the past 6 hours.  No recent antibiotics.  She tried a heating pad with improvement in her symptoms.  Symptoms worse with urination.  She has a past medical history of diabetes, GERD, hypertension, hypercholesterolemia, frequent UTI.  States that she gets over [place].  No history of pyelonephritis/nephrolithiasis.  [facility].  She has follow-up with them in a month.  Positive urine culture in 2023 E. coli that was sensitive to cephalosporins, Cipro, Macrobid and Bactrim.  Past Medical History:  Diagnosis Date   BRCA negative 2016   MyRisk neg except NBN VUS   Depression    Diabetes mellitus without complication (HCC)    Family history of ovarian cancer    Pt is My Risk neg 5/16 except NBN VUS   GERD (gastroesophageal reflux disease)    Hypertension    Iron deficiency     Past Surgical History:  Procedure Laterality Date   BREAST BIOPSY Right 2014   calcs with Sankar, benign, with clip   BREAST SURGERY Right    biopsy; Dr. Evette Cristal   CERVICAL DISCECTOMY  03/15/2010   CESAREAN SECTION  1991   COLONOSCOPY     LASIK     VAGINAL BIRTH AFTER CESAREAN SECTION  1993 and 1997    Family History  Problem Relation Age of Onset   Lung cancer Mother    COPD Father    Hypertension Father    Allergies Father    Coronary artery disease Father    Healthy Sister    Heart attack Paternal Grandmother    Ovarian cancer Paternal Grandmother    Heart attack Paternal Grandfather    Breast cancer Neg Hx     Social History   Tobacco Use   Smoking  status: Never   Smokeless tobacco: Never  Vaping Use   Vaping status: Never Used  Substance Use Topics   Alcohol use: No   Drug use: No    No current facility-administered medications for this encounter.  Current Outpatient Medications:    escitalopram (LEXAPRO) 10 MG tablet, Take by mouth., Disp: , Rfl:    nitrofurantoin, macrocrystal-monohydrate, (MACROBID) 100 MG capsule, Take 1 capsule (100 mg total) by mouth 2 (two) times daily for 5 days., Disp: 10 capsule, Rfl: 0   phenazopyridine (PYRIDIUM) 200 MG tablet, Take 1 tablet (200 mg total) by mouth 3 (three) times daily as needed for pain., Disp: 6 tablet, Rfl: 0   ASPIRIN LOW DOSE 81 MG tablet, Take 81 mg by mouth daily., Disp: , Rfl:    atorvastatin (LIPITOR) 40 MG tablet, Take 40 mg by mouth daily., Disp: , Rfl:    cetirizine (ZYRTEC) 10 MG tablet, Take 10 mg by mouth daily., Disp: , Rfl:    Continuous Blood Gluc Sensor (FREESTYLE LIBRE 14 DAY SENSOR) MISC, Apply topically., Disp: , Rfl:    desvenlafaxine (PRISTIQ) 50 MG 24 hr tablet, Take 50 mg by mouth daily. , Disp: , Rfl:    folic acid (FOLVITE) 400 MCG tablet, Take 400 mcg by mouth daily., Disp: , Rfl:  glucose blood (ONETOUCH VERIO) test strip, To check blood sugar once daily, Disp: 100 each, Rfl: 5   hydrochlorothiazide (HYDRODIURIL) 12.5 MG tablet, Take 12.5 mg by mouth daily., Disp: , Rfl:    lisinopril (ZESTRIL) 10 MG tablet, Take 10 mg by mouth daily., Disp: , Rfl:    ONETOUCH DELICA LANCETS FINE MISC, To check blood sugar once a day DX:  E11.9, Disp: 100 each, Rfl: 5   pantoprazole (PROTONIX) 40 MG tablet, TAKE 1 TABLET(40 MG) BY MOUTH DAILY, Disp: 90 tablet, Rfl: 0   Semaglutide, 1 MG/DOSE, 4 MG/3ML SOPN, Inject into the skin., Disp: , Rfl:    tolterodine (DETROL LA) 4 MG 24 hr capsule, Take 1 capsule (4 mg total) by mouth daily. TAKE 1 CAPSULE(4 MG) BY MOUTH DAILY, Disp: 90 capsule, Rfl: 3   Vitamin D, Ergocalciferol, (DRISDOL) 1.25 MG (50000 UNIT) CAPS capsule, Take  50,000 Units by mouth once a week., Disp: , Rfl:   No Known Allergies   ROS  As noted in HPI.   Physical Exam  BP 131/80 (BP Location: Right Arm)   Pulse 75   Temp 98.1 F (36.7 C) (Oral)   Resp 16   LMP 07/18/2010   SpO2 97%   Constitutional: Well developed, well nourished, no acute distress Eyes:  EOMI, conjunctiva normal bilaterally HENT: Normocephalic, atraumatic,mucus membranes moist Respiratory: Normal inspiratory effort Cardiovascular: Normal rate GI: nondistended soft.  No suprapubic, flank tenderness Back: No CVAT skin: No rash, skin intact Musculoskeletal: no deformities Neurologic: Alert & oriented x 3, no focal neuro deficits Psychiatric: Speech and behavior appropriate   ED Course   Medications - No data to display  Orders Placed This Encounter  Procedures   Urine Culture    Standing Status:   Standing    Number of Occurrences:   1   Urinalysis, w/ Reflex to Culture (Infection Suspected) -Urine, Clean Catch    Standing Status:   Standing    Number of Occurrences:   1    Specimen Source:   Urine, Clean Catch [76]    Results for orders placed or performed during the hospital encounter of 09/30/23 (from the past 24 hours)  Urinalysis, w/ Reflex to Culture (Infection Suspected) -Urine, Clean Catch     Status: Abnormal   Collection Time: 09/30/23 12:55 PM  Result Value Ref Range   Specimen Source URINE, CLEAN CATCH    Color, Urine STRAW (A) YELLOW   APPearance HAZY (A) CLEAR   Specific Gravity, Urine 1.015 1.005 - 1.030   pH 6.5 5.0 - 8.0   Glucose, UA NEGATIVE NEGATIVE mg/dL   Hgb urine dipstick LARGE (A) NEGATIVE   Bilirubin Urine NEGATIVE NEGATIVE   Ketones, ur NEGATIVE NEGATIVE mg/dL   Protein, ur 30 (A) NEGATIVE mg/dL   Nitrite NEGATIVE NEGATIVE   Leukocytes,Ua MODERATE (A) NEGATIVE   Squamous Epithelial / HPF 0-5 0 - 5 /HPF   WBC, UA >50 0 - 5 WBC/hpf   RBC / HPF 6-10 0 - 5 RBC/hpf   Bacteria, UA FEW (A) NONE SEEN   WBC Clumps PRESENT     No results found.  ED Clinical Impression  1. Urinary tract infection with hematuria, site unspecified      ED Assessment/Plan     Previous urine culture results reviewed.  As noted in HPI.  Urinalysis consistent with a UTI large hematuria, proteinuria, moderate leukocytes, few bacteria and pyuria.  This was sent off for culture.  Home with with Macrobid, Pyridium, cranberry juice,  continue increased fluids.  Follow-up PCP as scheduled in 1 month.  She plans to talk to them about urology referral at that time.  Discussed labs,  MDM, treatment plan, and plan for follow-up with patient. patient agrees with plan.   Meds ordered this encounter  Medications   nitrofurantoin, macrocrystal-monohydrate, (MACROBID) 100 MG capsule    Sig: Take 1 capsule (100 mg total) by mouth 2 (two) times daily for 5 days.    Dispense:  10 capsule    Refill:  0   phenazopyridine (PYRIDIUM) 200 MG tablet    Sig: Take 1 tablet (200 mg total) by mouth 3 (three) times daily as needed for pain.    Dispense:  6 tablet    Refill:  0      *This clinic note was created using Scientist, clinical (histocompatibility and immunogenetics). Therefore, there may be occasional mistakes despite careful proofreading.  ?    Domenick Gong, MD 09/30/23 1818

## 2023-09-30 NOTE — Discharge Instructions (Addendum)
 Make sure you finish the Macrobid, even if you feel better.  Pyridium will turn your urine orange, but will relieve your symptoms.  Make sure you continue drinking plenty of extra fluids, and you can add cranberry juice/cranberry pills to help resolve this and prevent future UTIs.  Make sure you talk to your doctor about following up with urology since you get these frequently.  We have sent your urine off for culture, and we will contact you if we need to change your antibiotics.

## 2023-10-01 LAB — URINE CULTURE: Culture: <10000 — AB

## 2023-12-17 ENCOUNTER — Other Ambulatory Visit: Payer: Self-pay | Admitting: Obstetrics and Gynecology

## 2023-12-17 DIAGNOSIS — Z1231 Encounter for screening mammogram for malignant neoplasm of breast: Secondary | ICD-10-CM

## 2023-12-26 ENCOUNTER — Ambulatory Visit
Admission: RE | Admit: 2023-12-26 | Discharge: 2023-12-26 | Disposition: A | Discharge status: Home / Self Care | Source: Ambulatory Visit | Attending: Obstetrics and Gynecology | Admitting: Obstetrics and Gynecology

## 2023-12-26 DIAGNOSIS — Z1231 Encounter for screening mammogram for malignant neoplasm of breast: Secondary | ICD-10-CM | POA: Diagnosis present

## 2024-01-01 ENCOUNTER — Ambulatory Visit: Payer: Self-pay | Admitting: Obstetrics and Gynecology

## 2024-01-06 NOTE — Progress Notes (Unsigned)
 PCP: Sherial Bail, MD   No chief complaint on file.   HPI:      Misty Graham is a 58 y.o. [id number] whose LMP was Patient's last menstrual period was 07/18/2010., presents today for her annual examination.  Her menses are absent due to menopause. No PMB.  She does not have vasomotor sx.   Sex activity: single partner, contraception - post menopausal status. She does not have vaginal dryness/pain/bleeding.  Last Pap: 12/28/22 Results were NILM/POS HPV DNA, repeat due today per [facility].  02/27/22 Results were: no abnormalities /neg HPV DNA.  08/23/21 ASCUS 01/09/21 ASCUS 07/08/20 Colpo with CIN 1 on bx 06/15/20 ASCUS/pos HPV DNA ASCUS 6 mos ago; CIN I by Biopsy 12 mos ago; Hx of STDs: HPV  Last mammogram: 12/26/23  Results were: normal--routine follow-up in 12 months There is no FH of breast cancer. There is a FH of ovarian cancerin her PGM. Pt is MyRisk neg 2016 except NBN VUS.The patient does not do self-breast exams.  Colonoscopy: ~9 yrs ago, Repeat due after 10 years.   Tobacco use: The patient denies current or previous tobacco use. Alcohol use: none No drug use Exercise: not active  She does get adequate calcium but not Vitamin D in her diet. Hx of Vit D deficiency in past.   Labs with PCP.   Patient Active Problem List   Diagnosis Date Noted   CIN I (cervical intraepithelial neoplasia I) 01/09/2021   ASCUS with positive high risk HPV cervical 07/08/2020   Pneumonia due to COVID-19 virus 07/31/2019   COVID-19 virus infection 07/30/2019   Essential hypertension 07/30/2019   Elevated lactic acid level 07/30/2019   Diabetes mellitus type 2, controlled, without complications (HCC) 12/09/2017   Hypercholesterolemia 12/09/2017   Major depression in remission (HCC) 12/09/2017   Overactive bladder 11/14/2016   Neck pain on right side 10/21/2015   Diabetes mellitus (HCC) 09/06/2015   Adaptation reaction 07/18/2015   Allergic rhinitis 07/18/2015   Acid reflux 07/18/2015    Restless leg 07/18/2015   BP (high blood pressure) 04/04/2015   Iron deficiency anemia 01/05/2015   Cervical pain 10/29/2009   Past Medical History:  Diagnosis Date   BRCA negative 2016   MyRisk neg except NBN VUS   Depression    Diabetes mellitus without complication (HCC)    Family history of ovarian cancer    Pt is My Risk neg 5/16 except NBN VUS   GERD (gastroesophageal reflux disease)    Hypertension    Iron deficiency     Past Surgical History:  Procedure Laterality Date   BREAST BIOPSY Right 2014   calcs with Sankar, benign, with clip   BREAST SURGERY Right    biopsy; Dr. Dellie   CERVICAL DISCECTOMY  03/15/2010   CESAREAN SECTION  1991   COLONOSCOPY     LASIK     VAGINAL BIRTH AFTER CESAREAN SECTION  1993 and 1997    Family History  Problem Relation Age of Onset   Lung cancer Mother    COPD Father    Hypertension Father    Allergies Father    Coronary artery disease Father    Healthy Sister    Heart attack Paternal Grandmother    Ovarian cancer Paternal Grandmother    Heart attack Paternal Grandfather    Breast cancer Neg Hx     Social History   Socioeconomic History   Marital status: Divorced    Spouse name: Not on file   Number of  children: Not on file   Years of education: Not on file   Highest education level: Not on file  Occupational History   Not on file  Tobacco Use   Smoking status: Never   Smokeless tobacco: Never  Vaping Use   Vaping status: Never Used  Substance and Sexual Activity   Alcohol use: No   Drug use: No   Sexual activity: Yes    Birth control/protection: Post-menopausal  Other Topics Concern   Not on file  Social History Narrative   Not on file   Social Drivers of Health   Financial Resource Strain: Low Risk  (07/15/2023)   Received from [facility]   Overall Financial Resource Strain (CARDIA)    Difficulty of Paying Living Expenses: Not hard at all  Food Insecurity: No Food Insecurity  (07/15/2023)   Received from [facility]   Hunger Vital Sign    Within the past 12 months, you worried that your food would run out before you got the money to buy more.: Never true    Within the past 12 months, the food you bought just didn't last and you didn't have money to get more.: Never true  Transportation Needs: No Transportation Needs (07/15/2023)   Received from [facility] - Transportation    In the past 12 months, has lack of transportation kept you from medical appointments or from getting medications?: No    Lack of Transportation (Non-Medical): No  Physical Activity: Not on file  Stress: Not on file  Social Connections: Not on file  Intimate Partner Violence: Not on file     Current Outpatient Medications:    ASPIRIN LOW DOSE 81 MG tablet, Take 81 mg by mouth daily., Disp: , Rfl:    atorvastatin (LIPITOR) 40 MG tablet, Take 40 mg by mouth daily., Disp: , Rfl:    cetirizine (ZYRTEC) 10 MG tablet, Take 10 mg by mouth daily., Disp: , Rfl:    Continuous Blood Gluc Sensor (FREESTYLE LIBRE 14 DAY SENSOR) MISC, Apply topically., Disp: , Rfl:    desvenlafaxine (PRISTIQ) 50 MG 24 hr tablet, Take 50 mg by mouth daily. , Disp: , Rfl:    folic acid (FOLVITE) 400 MCG tablet, Take 400 mcg by mouth daily., Disp: , Rfl:    glucose blood (ONETOUCH VERIO) test strip, To check blood sugar once daily, Disp: 100 each, Rfl: 5   hydrochlorothiazide  (HYDRODIURIL ) 12.5 MG tablet, Take 12.5 mg by mouth daily., Disp: , Rfl:    lisinopril  (ZESTRIL ) 10 MG tablet, Take 10 mg by mouth daily., Disp: , Rfl:    ONETOUCH DELICA LANCETS FINE MISC, To check blood sugar once a day DX:  E11.9, Disp: 100 each, Rfl: 5   pantoprazole  (PROTONIX ) 40 MG tablet, TAKE 1 TABLET(40 MG) BY MOUTH DAILY, Disp: 90 tablet, Rfl: 0   phenazopyridine  (PYRIDIUM ) 200 MG tablet, Take 1 tablet (200 mg total) by mouth 3 (three) times daily as needed for pain., Disp: 6 tablet, Rfl: 0    Semaglutide, 1 MG/DOSE, 4 MG/3ML SOPN, Inject into the skin., Disp: , Rfl:    tolterodine  (DETROL  LA) 4 MG 24 hr capsule, Take 1 capsule (4 mg total) by mouth daily. TAKE 1 CAPSULE(4 MG) BY MOUTH DAILY, Disp: 90 capsule, Rfl: 3   Vitamin D, Ergocalciferol, (DRISDOL) 1.25 MG (50000 UNIT) CAPS capsule, Take 50,000 Units by mouth once a week., Disp: , Rfl:      ROS:  Review of  Systems  Constitutional:  Negative for fatigue, fever and unexpected weight change.  Respiratory:  Negative for cough, shortness of breath and wheezing.   Cardiovascular:  Negative for chest pain, palpitations and leg swelling.  Gastrointestinal:  Negative for blood in stool, constipation, diarrhea, nausea and vomiting.  Endocrine: Negative for cold intolerance, heat intolerance and polyuria.  Genitourinary:  Negative for dyspareunia, dysuria, flank pain, frequency, genital sores, hematuria, menstrual problem, pelvic pain, urgency, vaginal bleeding, vaginal discharge and vaginal pain.  Musculoskeletal:  Negative for back pain, joint swelling and myalgias.  Skin:  Negative for rash.  Neurological:  Negative for dizziness, syncope, light-headedness, numbness and headaches.  Hematological:  Negative for adenopathy.  Psychiatric/Behavioral:  Negative for agitation, confusion, sleep disturbance and suicidal ideas. The patient is not nervous/anxious.    BREAST: No symptoms    Objective: LMP 07/18/2010    Physical Exam Constitutional:      Appearance: She is well-developed.  Genitourinary:     Vulva normal.     Right Labia: No rash, tenderness or lesions.    Left Labia: No tenderness, lesions or rash.    No vaginal discharge, erythema or tenderness.      Right Adnexa: not tender and no mass present.    Left Adnexa: not tender and no mass present.    No cervical friability or polyp.     Uterus is not enlarged or tender.  Breasts:    Right: No mass, nipple discharge, skin change or tenderness.     Left: No  mass, nipple discharge, skin change or tenderness.  Neck:     Thyroid: No thyromegaly.  Cardiovascular:     Rate and Rhythm: Normal rate and regular rhythm.     Heart sounds: Normal heart sounds. No murmur heard. Pulmonary:     Effort: Pulmonary effort is normal.     Breath sounds: Normal breath sounds.  Abdominal:     Palpations: Abdomen is soft.     Tenderness: There is no abdominal tenderness. There is no guarding or rebound.  Musculoskeletal:        General: Normal range of motion.     Cervical back: Normal range of motion.  Lymphadenopathy:     Cervical: No cervical adenopathy.  Neurological:     General: No focal deficit present.     Mental Status: She is alert and oriented to person, place, and time.     Cranial Nerves: No cranial nerve deficit.  Skin:    General: Skin is warm and dry.  Psychiatric:        Mood and Affect: Mood normal.        Behavior: Behavior normal.        Thought Content: Thought content normal.        Judgment: Judgment normal.  Vitals reviewed.     Assessment/Plan:  Encounter for annual routine gynecological examination  Cervical cancer screening - Plan: Cytology - PAP  Screening for HPV (human papillomavirus) - Plan: Cytology - PAP  CIN I (cervical intraepithelial neoplasia I) - Plan: Cytology - PAP; repeat pap today, will f/u with results.   Encounter for screening mammogram for malignant neoplasm of breast; pt current on mammo  Family history of ovarian cancer--pt is MyRisk neg. No increased screening recommendations          GYN counsel menopause, adequate intake of calcium and vitamin D, diet and exercise    F/U  No follow-ups on file.  Keyshia Orwick B. Averey Trompeter, PA-C 01/06/2024 5:10 PM

## 2024-01-07 ENCOUNTER — Ambulatory Visit (INDEPENDENT_AMBULATORY_CARE_PROVIDER_SITE_OTHER): Admitting: Obstetrics and Gynecology

## 2024-01-07 ENCOUNTER — Other Ambulatory Visit (HOSPITAL_COMMUNITY)
Admission: RE | Admit: 2024-01-07 | Discharge: 2024-01-07 | Disposition: A | Discharge status: Home / Self Care | Source: Ambulatory Visit | Attending: Obstetrics and Gynecology | Admitting: Obstetrics and Gynecology

## 2024-01-07 ENCOUNTER — Encounter: Payer: Self-pay | Admitting: Obstetrics and Gynecology

## 2024-01-07 VITALS — BP 87/57 | HR 85 | Ht 66.0 in | Wt 146.0 lb

## 2024-01-07 DIAGNOSIS — Z1151 Encounter for screening for human papillomavirus (HPV): Secondary | ICD-10-CM | POA: Insufficient documentation

## 2024-01-07 DIAGNOSIS — N870 Mild cervical dysplasia: Secondary | ICD-10-CM | POA: Diagnosis not present

## 2024-01-07 DIAGNOSIS — Z124 Encounter for screening for malignant neoplasm of cervix: Secondary | ICD-10-CM

## 2024-01-07 DIAGNOSIS — Z1272 Encounter for screening for malignant neoplasm of vagina: Secondary | ICD-10-CM | POA: Diagnosis not present

## 2024-01-07 DIAGNOSIS — Z01419 Encounter for gynecological examination (general) (routine) without abnormal findings: Secondary | ICD-10-CM

## 2024-01-07 DIAGNOSIS — N3281 Overactive bladder: Secondary | ICD-10-CM | POA: Diagnosis not present

## 2024-01-07 DIAGNOSIS — Z1211 Encounter for screening for malignant neoplasm of colon: Secondary | ICD-10-CM

## 2024-01-07 DIAGNOSIS — Z1231 Encounter for screening mammogram for malignant neoplasm of breast: Secondary | ICD-10-CM

## 2024-01-07 MED ORDER — TOLTERODINE TARTRATE ER 4 MG PO CP24: 4.0000 mg | ORAL_CAPSULE | Freq: Every day | ORAL | 3 refills | Status: AC

## 2024-01-07 NOTE — Patient Instructions (Signed)
 I value your feedback and you entrusting [place] with your care. If you get a [place] patient survey, I would appreciate you taking the time to let us know about your experience today. Thank you! ? ? ?

## 2024-01-08 ENCOUNTER — Other Ambulatory Visit: Payer: Self-pay

## 2024-01-08 ENCOUNTER — Telehealth: Payer: Self-pay

## 2024-01-08 DIAGNOSIS — Z1211 Encounter for screening for malignant neoplasm of colon: Secondary | ICD-10-CM

## 2024-01-08 MED ORDER — NA SULFATE-K SULFATE-MG SULF 17.5-3.13-1.6 GM/177ML PO SOLN: 1.0000 | Freq: Once | ORAL | 0 refills | Status: AC

## 2024-01-08 NOTE — Telephone Encounter (Signed)
 Gastroenterology Pre-Procedure Review  Request Date: 03/30/24 Requesting Physician: Dr. Jinny  PATIENT REVIEW QUESTIONS: The patient responded to the following health history questions as indicated:    1. Are you having any GI issues? no 2. Do you have a personal history of Polyps? no. Per Dr. Felicitas 11/21/22,  The patient's last colonoscopy was 2015 and she is not due for repeat colonoscopy until next year.  3. Do you have a family history of Colon Cancer or Polyps? no 4. Diabetes Mellitus? Yes takes Ozempic has been advised to stop 7 days prior  5. Joint replacements in the past 12 months?no 6. Major health problems in the past 3 months?no 7. Any artificial heart valves, MVP, or defibrillator?no    MEDICATIONS & ALLERGIES:    Patient reports the following regarding taking any anticoagulation/antiplatelet therapy:   Plavix, Coumadin, Eliquis, Xarelto, Lovenox , Pradaxa, Brilinta, or Effient? no Aspirin? yes (81 mg daily)  Patient confirms/reports the following medications:  Current Outpatient Medications  Medication Sig Dispense Refill   Na Sulfate-K Sulfate-Mg Sulfate concentrate (SUPREP) 17.5-3.13-1.6 GM/177ML SOLN Take 1 kit (354 mLs total) by mouth once for 1 dose. 354 mL 0   ASPIRIN LOW DOSE 81 MG tablet Take 81 mg by mouth daily.     atorvastatin (LIPITOR) 40 MG tablet Take 40 mg by mouth daily.     cetirizine (ZYRTEC) 10 MG tablet Take 10 mg by mouth daily.     Continuous Blood Gluc Sensor (FREESTYLE LIBRE 14 DAY SENSOR) MISC Apply topically.     desvenlafaxine (PRISTIQ) 50 MG 24 hr tablet Take 50 mg by mouth daily.      escitalopram  (LEXAPRO ) 10 MG tablet Take 10 mg by mouth daily.     folic acid (FOLVITE) 400 MCG tablet Take 400 mcg by mouth daily.     glucose blood (ONETOUCH VERIO) test strip To check blood sugar once daily 100 each 5   hydrochlorothiazide  (HYDRODIURIL ) 12.5 MG tablet Take 12.5 mg by mouth daily.     ONETOUCH DELICA LANCETS FINE MISC To check blood sugar  once a day DX:  E11.9 100 each 5   OZEMPIC, 0.25 OR 0.5 MG/DOSE, 2 MG/3ML SOPN      pantoprazole  (PROTONIX ) 40 MG tablet TAKE 1 TABLET(40 MG) BY MOUTH DAILY 90 tablet 0   tolterodine  (DETROL  LA) 4 MG 24 hr capsule Take 1 capsule (4 mg total) by mouth daily. TAKE 1 CAPSULE(4 MG) BY MOUTH DAILY 90 capsule 3   No current facility-administered medications for this visit.    Patient confirms/reports the following allergies:  No Known Allergies  No orders of the defined types were placed in this encounter.   AUTHORIZATION INFORMATION Primary Insurance: 1D#: Group #:  Secondary Insurance: 1D#: Group #:  SCHEDULE INFORMATION: Date: 03/30/24 Time: Location: [facility]

## 2024-01-13 ENCOUNTER — Ambulatory Visit: Payer: Self-pay | Admitting: Obstetrics and Gynecology

## 2024-01-13 LAB — CYTOLOGY - PAP
Adequacy: ABSENT
Comment: NEGATIVE
Diagnosis: UNDETERMINED — AB
High risk HPV: NEGATIVE

## 2024-01-14 NOTE — Telephone Encounter (Signed)
Pls call pt to schedule colpo with MD. Thx.

## 2024-01-18 ENCOUNTER — Telehealth: Admitting: Family Medicine

## 2024-01-18 DIAGNOSIS — R3989 Other symptoms and signs involving the genitourinary system: Secondary | ICD-10-CM | POA: Diagnosis not present

## 2024-01-18 MED ORDER — CEPHALEXIN 500 MG PO CAPS: 500.0000 mg | ORAL_CAPSULE | Freq: Two times a day (BID) | ORAL | 0 refills | Status: AC

## 2024-01-18 NOTE — Progress Notes (Signed)

## 2024-02-03 ENCOUNTER — Other Ambulatory Visit (HOSPITAL_COMMUNITY)
Admission: RE | Admit: 2024-02-03 | Discharge: 2024-02-03 | Disposition: A | Discharge status: Home / Self Care | Source: Ambulatory Visit | Attending: Obstetrics & Gynecology | Admitting: Obstetrics & Gynecology

## 2024-02-03 ENCOUNTER — Encounter: Payer: Self-pay | Admitting: Obstetrics & Gynecology

## 2024-02-03 ENCOUNTER — Ambulatory Visit: Admitting: Obstetrics & Gynecology

## 2024-02-03 VITALS — BP 94/67 | HR 82 | Ht 66.0 in | Wt 147.0 lb

## 2024-02-03 DIAGNOSIS — N898 Other specified noninflammatory disorders of vagina: Secondary | ICD-10-CM | POA: Insufficient documentation

## 2024-02-03 DIAGNOSIS — R87610 Atypical squamous cells of undetermined significance on cytologic smear of cervix (ASC-US): Secondary | ICD-10-CM

## 2024-02-03 NOTE — Addendum Note (Signed)
 Addended by: STARLA HARLAND BROCKS on: 02/03/2024 09:28 AM   Modules accepted: Orders

## 2024-02-03 NOTE — Progress Notes (Addendum)
    GYNECOLOGY PROGRESS NOTE  Subjective:    Patient ID: Misty Graham, female    DOB: [birth year], 58 y.o.   MRN: [id number]  HPI  Patient is a 58 y.o. [id number] here for a colpo due to ASCUS negative HR HPV pap 12/2023. Her pap history is as follows:  12/28/22 Results were NILM/POS HPV DNA 02/27/22 Results were: no abnormalities /neg HPV DNA.  08/23/21 ASCUS 01/09/21 ASCUS 07/08/20 Colpo with CIN 1 on bx 06/15/20 ASCUS/pos HPV DNA ASCUS 6 mos ago; CIN I by Biopsy 12 mos ago; Hx of STDs: HPV  The following portions of the patient's history were reviewed and updated as appropriate: allergies, current medications, past family history, past medical history, past social history, past surgical history, and problem list.  Review of Systems Pertinent items are noted in HPI.   Objective:   Blood pressure 94/67, pulse 82, height 5' 6 (1.676 m), weight 147 lb (66.7 kg), last menstrual period 07/18/2010. Body mass index is 23.73 kg/m.  Well nourished, well hydrated White female, no apparent distress She is ambulating and conversing normally. Consent signed, time out done Speculum placed. Frothy cervical discharge c/w BV (She denies symptoms). Cervix prepped with acetic acid. Transformation zone seen in its entirety. Colpo adequate. Colposcopic findings normal. ECC obtained after placing a single tooth tenaculum on the anterior lip of the cervix. She tolerated the procedure well.    Assessment:   1. ASCUS of cervix with negative high risk HPV   2.     Vaginal discharge  Plan:   1. ASCUS of cervix with negative high risk HPV (Primary) Await pathology Encourage healthy habits   2.  Aptima sent

## 2024-02-04 ENCOUNTER — Other Ambulatory Visit: Payer: Self-pay | Admitting: Obstetrics & Gynecology

## 2024-02-04 ENCOUNTER — Encounter: Payer: Self-pay | Admitting: Obstetrics & Gynecology

## 2024-02-04 DIAGNOSIS — B9689 Other specified bacterial agents as the cause of diseases classified elsewhere: Secondary | ICD-10-CM

## 2024-02-04 LAB — CERVICOVAGINAL ANCILLARY ONLY
Bacterial Vaginitis (gardnerella): POSITIVE — AB
Candida Glabrata: NEGATIVE
Candida Vaginitis: NEGATIVE
Comment: NEGATIVE
Comment: NEGATIVE
Comment: NEGATIVE
Comment: NEGATIVE
Trichomonas: NEGATIVE

## 2024-02-04 MED ORDER — METRONIDAZOLE 500 MG PO TABS: 500.0000 mg | ORAL_TABLET | Freq: Two times a day (BID) | ORAL | 0 refills | Status: AC

## 2024-02-04 NOTE — Progress Notes (Unsigned)
 Flagyl  prescribed to treat bv Message sent

## 2024-02-05 LAB — SURGICAL PATHOLOGY

## 2024-02-07 ENCOUNTER — Encounter: Payer: Self-pay | Admitting: Obstetrics & Gynecology

## 2024-03-30 ENCOUNTER — Encounter
Admission: RE | Disposition: A | Discharge status: Home / Self Care | Payer: Self-pay | Source: Home / Self Care | Attending: Gastroenterology

## 2024-03-30 ENCOUNTER — Encounter: Payer: Self-pay | Admitting: Gastroenterology

## 2024-03-30 ENCOUNTER — Ambulatory Visit: Admitting: Anesthesiology

## 2024-03-30 ENCOUNTER — Ambulatory Visit
Admission: RE | Admit: 2024-03-30 | Discharge: 2024-03-30 | Disposition: A | Discharge status: Home / Self Care | Attending: Gastroenterology | Admitting: Gastroenterology

## 2024-03-30 DIAGNOSIS — Z79899 Other long term (current) drug therapy: Secondary | ICD-10-CM | POA: Diagnosis not present

## 2024-03-30 DIAGNOSIS — K219 Gastro-esophageal reflux disease without esophagitis: Secondary | ICD-10-CM | POA: Diagnosis not present

## 2024-03-30 DIAGNOSIS — Z1211 Encounter for screening for malignant neoplasm of colon: Secondary | ICD-10-CM | POA: Diagnosis present

## 2024-03-30 DIAGNOSIS — K640 First degree hemorrhoids: Secondary | ICD-10-CM

## 2024-03-30 DIAGNOSIS — I10 Essential (primary) hypertension: Secondary | ICD-10-CM | POA: Insufficient documentation

## 2024-03-30 LAB — GLUCOSE, CAPILLARY: Glucose-Capillary: 97 mg/dL (ref 70–99)

## 2024-03-30 SURGERY — COLONOSCOPY
Anesthesia: General

## 2024-03-30 MED ORDER — DEXMEDETOMIDINE HCL IN NACL 80 MCG/20ML IV SOLN
INTRAVENOUS | Status: DC | PRN
  Administered 2024-03-30: 8 ug via INTRAVENOUS
  Administered 2024-03-30: 12 ug via INTRAVENOUS

## 2024-03-30 MED ORDER — LIDOCAINE HCL (PF) 2 % IJ SOLN
INTRAMUSCULAR | Status: AC
  Filled 2024-03-30: qty 5

## 2024-03-30 MED ORDER — SODIUM CHLORIDE 0.9 % IV SOLN
INTRAVENOUS | Status: DC
  Administered 2024-03-30: 20 mL/h via INTRAVENOUS

## 2024-03-30 MED ORDER — PROPOFOL 10 MG/ML IV BOLUS
INTRAVENOUS | Status: DC | PRN
Start: 2024-03-30 — End: 2024-03-30
  Administered 2024-03-30: 20 mg via INTRAVENOUS
  Administered 2024-03-30: 50 mg via INTRAVENOUS
  Administered 2024-03-30: 30 mg via INTRAVENOUS

## 2024-03-30 MED ORDER — LIDOCAINE HCL (CARDIAC) PF 100 MG/5ML IV SOSY
PREFILLED_SYRINGE | INTRAVENOUS | Status: DC | PRN
  Administered 2024-03-30: 60 mg via INTRAVENOUS

## 2024-03-30 MED ORDER — PROPOFOL 500 MG/50ML IV EMUL
INTRAVENOUS | Status: DC | PRN
  Administered 2024-03-30: 75 ug/kg/min via INTRAVENOUS

## 2024-03-30 MED ORDER — EPHEDRINE SULFATE-NACL 50-0.9 MG/10ML-% IV SOSY
PREFILLED_SYRINGE | INTRAVENOUS | Status: DC | PRN
  Administered 2024-03-30: 10 mg via INTRAVENOUS

## 2024-03-30 NOTE — Op Note (Signed)
 [facility] Gastroenterology Patient Name: Misty Graham Procedure Date: 03/30/2024 9:00 AM MRN: [id number] Account #: [id number] Date of Birth: 1966/05/28 Admit Type: Outpatient Age: 58 Room: [facility] ENDO ROOM 4 Gender: Female Note Status: Finalized Instrument Name: Colon Scope [id number] Procedure:             Colonoscopy Indications:           Screening for colorectal malignant neoplasm Providers:             [name] MD, MD Referring MD:          Lavenia Beaver, MD (Referring MD) Medicines:             Propofol per Anesthesia Complications:         No immediate complications. Procedure:             Pre-Anesthesia Assessment:                        - Prior to the procedure, a History and Physical was                         performed, and patient medications and allergies were                         reviewed. The patient's tolerance of previous                         anesthesia was also reviewed. The risks and benefits                         of the procedure and the sedation options and risks                         were discussed with the patient. All questions were                         answered, and informed consent was obtained. Prior                         Anticoagulants: The patient has taken no anticoagulant                         or antiplatelet agents. ASA Grade Assessment: II - A                         patient with mild systemic disease. After reviewing                         the risks and benefits, the patient was deemed in                         satisfactory condition to undergo the procedure.                        After obtaining informed consent, the colonoscope was                         passed under direct vision. Throughout the procedure,  the patient's blood pressure, pulse, and oxygen                         saturations were monitored continuously. The                         Colonoscope was introduced  through the anus and                         advanced to the the cecum, identified by appendiceal                         orifice and ileocecal valve. The colonoscopy was                         performed without difficulty. The patient tolerated                         the procedure well. The quality of the bowel                         preparation was excellent. Findings:      The perianal and digital rectal examinations were normal.      Non-bleeding internal hemorrhoids were found during retroflexion. The       hemorrhoids were Grade I (internal hemorrhoids that do not prolapse). Impression:            - Non-bleeding internal hemorrhoids.                        - No specimens collected. Recommendation:        - Discharge patient to home.                        - Resume previous diet.                        - Continue present medications.                        - Repeat colonoscopy in 10 years for screening                         purposes. Procedure Code(s):     --- Professional ---                        [phone], Colonoscopy, flexible; diagnostic, including                         collection of specimen(s) by brushing or washing, when                         performed (separate procedure) Diagnosis Code(s):     --- Professional ---                        Z12.11, Encounter for screening for malignant neoplasm                         of colon CPT copyright 2022 American Medical Association. All rights reserved. The  codes documented in this report are preliminary and upon coder review may  be revised to meet current compliance requirements. [name] MD, MD 03/30/2024 9:25:59 AM This report has been signed electronically. Number of Addenda: 0 Note Initiated On: 03/30/2024 9:00 AM Scope Withdrawal Time: 0 hours 8 minutes 48 seconds  Total Procedure Duration: 0 hours 12 minutes 24 seconds  Estimated Blood Loss:  Estimated blood loss: none.      [facility]

## 2024-03-30 NOTE — Transfer of Care (Signed)
 Immediate Anesthesia Transfer of Care Note  [name]  Procedure(s) Performed: COLONOSCOPY  Patient Location: PACU  Anesthesia Type:General  Level of Consciousness: sedated  Airway & Oxygen Therapy: Patient Spontanous Breathing  Post-op Assessment: Report given to RN and Post -op Vital signs reviewed and stable  Post vital signs: Reviewed and stable  Last Vitals:  Vitals Value Taken Time  BP    Temp    Pulse 85 03/30/24 09:27  Resp 13 03/30/24 09:27  SpO2 96 % 03/30/24 09:27  Vitals shown include unfiled device data.  Last Pain:  Vitals:   03/30/24 0821  TempSrc: Temporal  PainSc: 0-No pain         Complications: No notable events documented.

## 2024-03-30 NOTE — H&P (Signed)
 Misty Copping, MD [facility] [address] [place], [place] [address] Phone: [phone] Fax : [phone]  Primary Care Physician:  Sherial Bail, MD Primary Gastroenterologist:  Dr. Copping  Pre-Procedure History & Physical: HPI:  Misty Graham is a 58 y.o. female is here for a screening colonoscopy.   Past Medical History:  Diagnosis Date   BRCA negative 2016   MyRisk neg except NBN VUS   Depression    Diabetes mellitus without complication (HCC)    Family history of ovarian cancer    Pt is My Risk neg 5/16 except NBN VUS   GERD (gastroesophageal reflux disease)    Hypertension    Iron deficiency     Past Surgical History:  Procedure Laterality Date   BREAST BIOPSY Right 2014   calcs with Sankar, benign, with clip   BREAST SURGERY Right    biopsy; Dr. Dellie   CERVICAL DISCECTOMY  03/15/2010   CESAREAN SECTION  1991   COLONOSCOPY     LASIK     VAGINAL BIRTH AFTER CESAREAN SECTION  1993 and 1997    Prior to Admission medications   Medication Sig Start Date End Date Taking? Authorizing Provider  ASPIRIN LOW DOSE 81 MG tablet Take 81 mg by mouth daily.   Yes [provider]  atorvastatin (LIPITOR) 40 MG tablet Take 40 mg by mouth daily. 03/17/19  Yes [provider]  cetirizine (ZYRTEC) 10 MG tablet Take 10 mg by mouth daily.   Yes [provider]  Continuous Blood Gluc Sensor (FREESTYLE LIBRE 14 DAY SENSOR) MISC Apply topically. 06/06/20  Yes [provider]  desvenlafaxine (PRISTIQ) 50 MG 24 hr tablet Take 50 mg by mouth daily.  11/20/18  Yes [provider]  escitalopram  (LEXAPRO ) 10 MG tablet Take 10 mg by mouth daily.   Yes [provider]  folic acid (FOLVITE) 400 MCG tablet Take 400 mcg by mouth daily.   Yes [provider]  glucose blood (ONETOUCH VERIO) test strip To check blood sugar once daily 10/17/16  Yes Burnette, Delon CHRISTELLA, PA-C  metroNIDAZOLE  (FLAGYL ) 500 MG tablet Take 1 tablet (500  mg total) by mouth 2 (two) times daily. 02/04/24  Yes Starla Harland BROCKS, MD  ONETOUCH DELICA LANCETS FINE MISC To check blood sugar once a day DX:  E11.9 07/29/15  Yes Agapito Mom, MD  OZEMPIC, 0.25 OR 0.5 MG/DOSE, 2 MG/3ML SOPN    Yes [provider]  pantoprazole  (PROTONIX ) 40 MG tablet TAKE 1 TABLET(40 MG) BY MOUTH DAILY 09/25/23  Yes Graham Rogelia, MD  tolterodine  (DETROL  LA) 4 MG 24 hr capsule Take 1 capsule (4 mg total) by mouth daily. TAKE 1 CAPSULE(4 MG) BY MOUTH DAILY 01/07/24  Yes Copland, Alicia B, PA-C    Allergies as of 01/08/2024   (No Known Allergies)    Family History  Problem Relation Age of Onset   Lung cancer Mother    COPD Father    Hypertension Father    Allergies Father    Coronary artery disease Father    Healthy Sister    Heart attack Paternal Grandmother    Ovarian cancer Paternal Grandmother    Heart attack Paternal Grandfather    Breast cancer Neg Hx     Social History   Socioeconomic History   Marital status: Divorced    Spouse name: Not on file   Number of children: Not on file   Years of education: Not on file   Highest education level: Not  on file  Occupational History   Not on file  Tobacco Use   Smoking status: Never   Smokeless tobacco: Never  Vaping Use   Vaping status: Never Used  Substance and Sexual Activity   Alcohol use: No   Drug use: No   Sexual activity: Yes    Birth control/protection: Post-menopausal  Other Topics Concern   Not on file  Social History Narrative   Not on file   Social Drivers of Health   Financial Resource Strain: Low Risk  (01/09/2024)   Received from [facility]   Overall Financial Resource Strain (CARDIA)    Difficulty of Paying Living Expenses: Not hard at all  Food Insecurity: No Food Insecurity (01/09/2024)   Received from [facility]   Hunger Vital Sign    Within the past 12 months, you worried that your food would run out before you got the money to buy  more.: Never true    Within the past 12 months, the food you bought just didn't last and you didn't have money to get more.: Never true  Transportation Needs: No Transportation Needs (01/09/2024)   Received from [facility]    In the past 12 months, has lack of transportation kept you from medical appointments or from getting medications?: No    Lack of Transportation (Non-Medical): No  Physical Activity: Not on file  Stress: Not on file  Social Connections: Not on file  Intimate Partner Violence: Not on file    Review of Systems: See HPI, otherwise negative ROS  Physical Exam: BP 117/82   Pulse 72   Temp (!) 96.7 F (35.9 C) (Temporal)   Resp 20   Ht 5' 6 (1.676 m)   Wt 65.1 kg   LMP 07/18/2010   SpO2 100%   BMI 23.18 kg/m  General:   Alert,  pleasant and cooperative in NAD Head:  Normocephalic and atraumatic. Neck:  Supple; no masses or thyromegaly. Lungs:  Clear throughout to auscultation.    Heart:  Regular rate and rhythm. Abdomen:  Soft, nontender and nondistended. Normal bowel sounds, without guarding, and without rebound.   Neurologic:  Alert and  oriented x4;  grossly normal neurologically.  Impression/Plan: Misty Graham is now here to undergo a screening colonoscopy.  Risks, benefits, and alternatives regarding colonoscopy have been reviewed with the patient.  Questions have been answered.  All parties agreeable.

## 2024-03-30 NOTE — Anesthesia Preprocedure Evaluation (Signed)
 Anesthesia Evaluation  Patient identified by MRN, date of birth, ID band Patient awake    Reviewed: Allergy & Precautions, H&P , NPO status , Patient's Chart, lab work & pertinent test results, reviewed documented beta blocker date and time   Airway Mallampati: II   Neck ROM: full    Dental  (+) Poor Dentition   Pulmonary pneumonia, resolved   Pulmonary exam normal        Cardiovascular Exercise Tolerance: Good hypertension, On Medications negative cardio ROS Normal cardiovascular exam Rhythm:regular Rate:Normal     Neuro/Psych    Depression    negative neurological ROS  negative psych ROS   GI/Hepatic Neg liver ROS,GERD  Medicated,,  Endo/Other  negative endocrine ROSdiabetes, Well Controlled    Renal/GU negative Renal ROS  negative genitourinary   Musculoskeletal   Abdominal   Peds  Hematology  (+) Blood dyscrasia, anemia   Anesthesia Other Findings Past Medical History: 2016: BRCA negative     Comment:  MyRisk neg except NBN VUS No date: Depression No date: Diabetes mellitus without complication (HCC) No date: Family history of ovarian cancer     Comment:  Pt is My Risk neg 5/16 except NBN VUS No date: GERD (gastroesophageal reflux disease) No date: Hypertension No date: Iron deficiency Past Surgical History: 2014: BREAST BIOPSY; Right     Comment:  calcs with Sankar, benign, with clip No date: BREAST SURGERY; Right     Comment:  biopsy; Dr. Dellie 03/15/2010: CERVICAL DISCECTOMY 1991: CESAREAN SECTION No date: COLONOSCOPY No date: LASIK 1993 and 1997: VAGINAL BIRTH AFTER CESAREAN SECTION BMI    Body Mass Index: 23.18 kg/m     Reproductive/Obstetrics negative OB ROS                              Anesthesia Physical Anesthesia Plan  ASA: 3  Anesthesia Plan: General   Post-op Pain Management:    Induction:   PONV Risk Score and Plan:   Airway Management  Planned:   Additional Equipment:   Intra-op Plan:   Post-operative Plan:   Informed Consent: I have reviewed the patients History and Physical, chart, labs and discussed the procedure including the risks, benefits and alternatives for the proposed anesthesia with the patient or authorized representative who has indicated his/her understanding and acceptance.     Dental Advisory Given  Plan Discussed with: CRNA  Anesthesia Plan Comments:         Anesthesia Quick Evaluation

## 2024-03-31 NOTE — Anesthesia Postprocedure Evaluation (Signed)
 Anesthesia Post Note  [name]  Procedure(s) Performed: COLONOSCOPY  Patient location during evaluation: PACU Anesthesia Type: General Level of consciousness: awake and alert Pain management: pain level controlled Vital Signs Assessment: post-procedure vital signs reviewed and stable Respiratory status: spontaneous breathing, nonlabored ventilation and respiratory function stable Cardiovascular status: blood pressure returned to baseline and stable Postop Assessment: no apparent nausea or vomiting Anesthetic complications: no   No notable events documented.   Last Vitals:  Vitals:   03/30/24 0940 03/30/24 0947  BP: 114/66 121/69  Pulse: 77 72  Resp: 13 (!) 9  Temp:    SpO2: 100% 100%    Last Pain:  Vitals:   03/30/24 0947  TempSrc:   PainSc: 0-No pain                 Camellia Merilee Louder

## 2024-05-20 ENCOUNTER — Telehealth: Admitting: Physician Assistant

## 2024-05-20 DIAGNOSIS — R3989 Other symptoms and signs involving the genitourinary system: Secondary | ICD-10-CM

## 2024-05-20 MED ORDER — CEPHALEXIN 500 MG PO CAPS: 500.0000 mg | ORAL_CAPSULE | Freq: Two times a day (BID) | ORAL | 0 refills | Status: AC

## 2024-05-20 NOTE — Progress Notes (Signed)

## 2024-06-09 ENCOUNTER — Telehealth: Admitting: Physician Assistant

## 2024-06-09 DIAGNOSIS — N390 Urinary tract infection, site not specified: Secondary | ICD-10-CM

## 2024-06-09 NOTE — Progress Notes (Signed)
°  Because of continued symptoms despite antibiotic and need for urine culture, I feel your condition warrants further evaluation and I recommend that you be seen in a face-to-face visit.   NOTE: There will be NO CHARGE for this E-Visit   If you are having a true medical emergency, please call 911.     For an urgent face to face visit, [facility] has multiple urgent care centers for your convenience.  Click the link below for the full list of locations and hours, walk-in wait times, appointment scheduling options and driving directions:  Urgent Care - [place], [place], [place], [place], [place], [place]  [place]     Your MyChart E-visit questionnaire answers were reviewed by a board certified advanced clinical practitioner to complete your personal care plan based on your specific symptoms.    Thank you for using e-Visits.

## 2024-07-26 ENCOUNTER — Telehealth: Admitting: Family Medicine

## 2024-07-26 DIAGNOSIS — N3000 Acute cystitis without hematuria: Secondary | ICD-10-CM

## 2024-07-26 DIAGNOSIS — R3989 Other symptoms and signs involving the genitourinary system: Secondary | ICD-10-CM

## 2024-07-26 MED ORDER — NITROFURANTOIN MONOHYD MACRO 100 MG PO CAPS: 100.0000 mg | ORAL_CAPSULE | Freq: Two times a day (BID) | ORAL | 0 refills | Status: AC

## 2024-07-26 NOTE — Patient Instructions (Signed)
 UTI (Urinary Tract Infection) in Females: What to Know A urinary tract infection (UTI) is an infection in your urinary tract. The urinary tract is made up of organs that make, store, and get rid of pee (urine) in your body. These organs include: The kidneys. The ureters. The bladder. The urethra. What are the causes? Most UTIs are caused by germs called bacteria. They may be in or near your genitals. These germs grow and cause swelling in your urinary tract. What increases the risk? You're more likely to get a UTI if: You're a female. The urethra is shorter in females than in males. You have a soft tube called a catheter that drains your pee. You can't control when you pee or poop. You have trouble peeing because of: A kidney stone. A urinary blockage. A nerve condition that affects your bladder. Not getting enough to drink. You're sexually active. You use a birth control inside your vagina, like spermicide. You're pregnant. You have low levels of the hormone estrogen in your body. You're an older adult. You're also more likely to get a UTI if you have other health problems. These may include: Diabetes. A weak immune system. Your immune system is your body's defense system. Sickle cell disease. Injury of the spine. What are the signs or symptoms? Symptoms may include: Needing to pee right away. Peeing small amounts often. Pain or burning when you pee. Blood in your pee. Pee that smells bad or odd. Pain in your belly or lower back. You may also: Feel confused. This may be the first symptom in older adults. Vomit. Not feel hungry. Feel tired or easily annoyed. Have a fever or chills. How is this diagnosed? A UTI is diagnosed based on your medical history and an exam. You may also have other tests. These may include: Pee tests. Blood tests. Tests for sexually transmitted infections (STIs). If you've had more than one UTI, you may need to have imaging studies done to find  out why you keep getting them. How is this treated? A UTI can be treated by: Taking antibiotics or other medicines. Drinking enough fluid to keep your pee pale yellow. In rare cases, a UTI can cause a very bad condition called sepsis. Sepsis may be treated in the hospital. Follow these instructions at home: Medicines Take your medicines only as told by your health care provider. If you were given antibiotics, take them as told by your provider. Do not stop taking them even if you start to feel better. General instructions Make sure you: Pee often and fully. Do not hold your pee for a long time. Wipe from front to back after you pee or poop. Use each tissue only once when you wipe. Pee after you have sex. Do not douche or use sprays or powders in your genital area. Contact a health care provider if: Your symptoms don't get better after 1-2 days of taking antibiotics. Your symptoms go away and then come back. You have a fever or chills. You vomit or feel like you may vomit. Get help right away if: You have very bad pain in your back or lower belly. You faint. This information is not intended to replace advice given to you by your health care provider. Make sure you discuss any questions you have with your health care provider. Document Revised: 04/23/2024 Document Reviewed: 09/14/2022 Elsevier Patient Education  2025 [employer].

## 2024-07-26 NOTE — Progress Notes (Signed)
 " Virtual Visit Consent   Misty Graham, you are scheduled for a virtual visit with a [facility] provider today. Just as with appointments in the office, your consent must be obtained to participate. Your consent will be active for this visit and any virtual visit you may have with one of our providers in the next 365 days. If you have a MyChart account, a copy of this consent can be sent to you electronically.  As this is a virtual visit, video technology does not allow for your provider to perform a traditional examination. This may limit your provider's ability to fully assess your condition. If your provider identifies any concerns that need to be evaluated in person or the need to arrange testing (such as labs, EKG, etc.), we will make arrangements to do so. Although advances in technology are sophisticated, we cannot ensure that it will always work on either your end or our end. If the connection with a video visit is poor, the visit may have to be switched to a telephone visit. With either a video or telephone visit, we are not always able to ensure that we have a secure connection.  By engaging in this virtual visit, you consent to the provision of healthcare and authorize for your insurance to be billed (if applicable) for the services provided during this visit. Depending on your insurance coverage, you may receive a charge related to this service.  I need to obtain your verbal consent now. Are you willing to proceed with your visit today? Misty Graham has provided verbal consent on 07/26/2024 for a virtual visit (video or telephone). Loa Lamp, FNP  Date: 07/26/2024 10:48 AM   Virtual Visit via Video Note   I, Loa Lamp, connected with  Misty Graham  ([id number], 1965-12-23) on 07/26/24 at 10:45 AM EST by a video-enabled telemedicine application and verified that I am speaking with the correct person using two identifiers.  Location: Patient: Virtual Visit Location Patient:  Home Provider: Virtual Visit Location Provider: Home Office   I discussed the limitations of evaluation and management by telemedicine and the availability of in person appointments. The patient expressed understanding and agreed to proceed.    History of Present Illness: Misty Graham is a 59 y.o. who identifies as a female who was assigned female at birth, and is being seen today for pain with urination, burning and frequency. No fever or severe abd pain.  HPI: HPI  Problems:  Patient Active Problem List   Diagnosis Date Noted   Encounter for screening colonoscopy 03/30/2024   CIN I (cervical intraepithelial neoplasia I) 01/09/2021   ASCUS with positive high risk HPV cervical 07/08/2020   Pneumonia due to COVID-19 virus 07/31/2019   COVID-19 virus infection 07/30/2019   Essential hypertension 07/30/2019   Elevated lactic acid level 07/30/2019   Diabetes mellitus type 2, controlled, without complications (HCC) 12/09/2017   Hypercholesterolemia 12/09/2017   Major depression in remission 12/09/2017   Overactive bladder 11/14/2016   Neck pain on right side 10/21/2015   Diabetes mellitus (HCC) 09/06/2015   Adaptation reaction 07/18/2015   Allergic rhinitis 07/18/2015   Acid reflux 07/18/2015   Restless leg 07/18/2015   BP (high blood pressure) 04/04/2015   Iron deficiency anemia 01/05/2015   Cervical pain 10/29/2009    Allergies: Allergies[1] Medications: Current Medications[2]  Observations/Objective: Patient is well-developed, well-nourished in no acute distress.  Resting comfortably  at home.  Head is normocephalic, atraumatic.  No labored breathing.  Speech  is clear and coherent with logical content.  Patient is alert and oriented at baseline.   Assessment and Plan: 1. Suspected UTI (Primary)  2. Acute cystitis without hematuria  Increase fluids, prevention discussed, UC as needed.   Follow Up Instructions: I discussed the assessment and treatment plan with the  patient. The patient was provided an opportunity to ask questions and all were answered. The patient agreed with the plan and demonstrated an understanding of the instructions.  A copy of instructions were sent to the patient via MyChart unless otherwise noted below.     The patient was advised to call back or seek an in-person evaluation if the symptoms worsen or if the condition fails to improve as anticipated.    Jamani Bearce, FNP     [1] No Known Allergies [2]  Current Outpatient Medications:    ASPIRIN LOW DOSE 81 MG tablet, Take 81 mg by mouth daily., Disp: , Rfl:    atorvastatin (LIPITOR) 40 MG tablet, Take 40 mg by mouth daily., Disp: , Rfl:    cetirizine (ZYRTEC) 10 MG tablet, Take 10 mg by mouth daily., Disp: , Rfl:    Continuous Blood Gluc Sensor (FREESTYLE LIBRE 14 DAY SENSOR) MISC, Apply topically., Disp: , Rfl:    desvenlafaxine (PRISTIQ) 50 MG 24 hr tablet, Take 50 mg by mouth daily. , Disp: , Rfl:    escitalopram  (LEXAPRO ) 10 MG tablet, Take 10 mg by mouth daily., Disp: , Rfl:    folic acid (FOLVITE) 400 MCG tablet, Take 400 mcg by mouth daily., Disp: , Rfl:    glucose blood (ONETOUCH VERIO) test strip, To check blood sugar once daily, Disp: 100 each, Rfl: 5   metroNIDAZOLE  (FLAGYL ) 500 MG tablet, Take 1 tablet (500 mg total) by mouth 2 (two) times daily., Disp: 14 tablet, Rfl: 0   ONETOUCH DELICA LANCETS FINE MISC, To check blood sugar once a day DX:  E11.9, Disp: 100 each, Rfl: 5   OZEMPIC, 0.25 OR 0.5 MG/DOSE, 2 MG/3ML SOPN, , Disp: , Rfl:    pantoprazole  (PROTONIX ) 40 MG tablet, TAKE 1 TABLET(40 MG) BY MOUTH DAILY, Disp: 90 tablet, Rfl: 0   tolterodine  (DETROL  LA) 4 MG 24 hr capsule, Take 1 capsule (4 mg total) by mouth daily. TAKE 1 CAPSULE(4 MG) BY MOUTH DAILY, Disp: 90 capsule, Rfl: 3  "
